# Patient Record
Sex: Female | Born: 1970 | Race: White | Hispanic: No | Marital: Married | Smoking: Former smoker
Health system: Southern US, Community
[De-identification: ages and names within clinical notes are randomized; demographics above are authoritative.]

## PROBLEM LIST (undated history)

## (undated) DIAGNOSIS — I10 Essential (primary) hypertension: Secondary | ICD-10-CM

## (undated) DIAGNOSIS — R112 Nausea with vomiting, unspecified: Secondary | ICD-10-CM

## (undated) DIAGNOSIS — Z9889 Other specified postprocedural states: Secondary | ICD-10-CM

## (undated) DIAGNOSIS — J189 Pneumonia, unspecified organism: Secondary | ICD-10-CM

## (undated) DIAGNOSIS — M1990 Unspecified osteoarthritis, unspecified site: Secondary | ICD-10-CM

## (undated) DIAGNOSIS — M797 Fibromyalgia: Secondary | ICD-10-CM

## (undated) HISTORY — PX: NASAL SINUS SURGERY: SHX719

## (undated) HISTORY — PX: WISDOM TOOTH EXTRACTION: SHX21

## (undated) HISTORY — PX: TUBAL LIGATION: SHX77

## (undated) HISTORY — PX: KNEE SURGERY: SHX244

## (undated) HISTORY — PX: TONSILLECTOMY: SUR1361

## (undated) SURGERY — Surgical Case
Anesthesia: *Unknown

---

## 1997-06-12 ENCOUNTER — Other Ambulatory Visit: Admission: RE | Admit: 1997-06-12 | Discharge: 1997-06-12 | Payer: Self-pay | Admitting: Obstetrics and Gynecology

## 1998-03-15 ENCOUNTER — Other Ambulatory Visit: Admission: RE | Admit: 1998-03-15 | Discharge: 1998-03-15 | Payer: Self-pay | Admitting: *Deleted

## 1998-09-28 ENCOUNTER — Inpatient Hospital Stay (HOSPITAL_COMMUNITY): Admission: AD | Admit: 1998-09-28 | Discharge: 1998-10-01 | Payer: Self-pay | Admitting: Obstetrics and Gynecology

## 1998-10-02 ENCOUNTER — Encounter (HOSPITAL_COMMUNITY): Admission: RE | Admit: 1998-10-02 | Discharge: 1998-12-31 | Payer: Self-pay | Admitting: Obstetrics and Gynecology

## 2000-07-07 ENCOUNTER — Encounter: Payer: Self-pay | Admitting: Family Medicine

## 2000-07-07 ENCOUNTER — Ambulatory Visit (HOSPITAL_COMMUNITY): Admission: RE | Admit: 2000-07-07 | Discharge: 2000-07-07 | Payer: Self-pay | Admitting: Family Medicine

## 2000-07-17 ENCOUNTER — Ambulatory Visit (HOSPITAL_COMMUNITY): Admission: RE | Admit: 2000-07-17 | Discharge: 2000-07-17 | Payer: Self-pay | Admitting: Family Medicine

## 2000-07-17 ENCOUNTER — Encounter: Payer: Self-pay | Admitting: Family Medicine

## 2000-09-16 ENCOUNTER — Other Ambulatory Visit: Admission: RE | Admit: 2000-09-16 | Discharge: 2000-09-16 | Payer: Self-pay | Admitting: Obstetrics and Gynecology

## 2002-02-03 ENCOUNTER — Other Ambulatory Visit: Admission: RE | Admit: 2002-02-03 | Discharge: 2002-02-03 | Payer: Self-pay | Admitting: Obstetrics and Gynecology

## 2002-03-25 ENCOUNTER — Encounter: Payer: Self-pay | Admitting: Obstetrics and Gynecology

## 2002-03-25 ENCOUNTER — Ambulatory Visit (HOSPITAL_COMMUNITY): Admission: RE | Admit: 2002-03-25 | Discharge: 2002-03-25 | Payer: Self-pay | Admitting: Obstetrics and Gynecology

## 2002-05-22 ENCOUNTER — Inpatient Hospital Stay (HOSPITAL_COMMUNITY): Admission: AD | Admit: 2002-05-22 | Discharge: 2002-05-24 | Payer: Self-pay | Admitting: Obstetrics and Gynecology

## 2002-05-23 ENCOUNTER — Encounter: Payer: Self-pay | Admitting: Obstetrics and Gynecology

## 2002-05-24 ENCOUNTER — Encounter: Payer: Self-pay | Admitting: Obstetrics and Gynecology

## 2002-06-22 ENCOUNTER — Ambulatory Visit: Admission: RE | Admit: 2002-06-22 | Discharge: 2002-06-22 | Payer: Self-pay | Admitting: Obstetrics and Gynecology

## 2002-06-22 ENCOUNTER — Encounter: Payer: Self-pay | Admitting: Cardiovascular Disease

## 2002-07-22 ENCOUNTER — Ambulatory Visit (HOSPITAL_COMMUNITY): Admission: RE | Admit: 2002-07-22 | Discharge: 2002-07-22 | Payer: Self-pay | Admitting: Obstetrics and Gynecology

## 2002-07-22 ENCOUNTER — Encounter: Payer: Self-pay | Admitting: Obstetrics and Gynecology

## 2002-07-27 ENCOUNTER — Encounter: Admission: RE | Admit: 2002-07-27 | Discharge: 2002-07-27 | Payer: Self-pay | Admitting: Obstetrics and Gynecology

## 2002-08-05 ENCOUNTER — Encounter: Admission: RE | Admit: 2002-08-05 | Discharge: 2002-08-05 | Payer: Self-pay | Admitting: Obstetrics and Gynecology

## 2002-08-11 ENCOUNTER — Encounter: Admission: RE | Admit: 2002-08-11 | Discharge: 2002-08-11 | Payer: Self-pay | Admitting: *Deleted

## 2002-08-16 ENCOUNTER — Encounter: Admission: RE | Admit: 2002-08-16 | Discharge: 2002-08-16 | Payer: Self-pay | Admitting: Obstetrics and Gynecology

## 2002-08-19 ENCOUNTER — Inpatient Hospital Stay (HOSPITAL_COMMUNITY): Admission: AD | Admit: 2002-08-19 | Discharge: 2002-08-22 | Payer: Self-pay | Admitting: Obstetrics and Gynecology

## 2002-08-19 ENCOUNTER — Encounter (INDEPENDENT_AMBULATORY_CARE_PROVIDER_SITE_OTHER): Payer: Self-pay | Admitting: Specialist

## 2003-11-30 ENCOUNTER — Ambulatory Visit (HOSPITAL_BASED_OUTPATIENT_CLINIC_OR_DEPARTMENT_OTHER): Admission: RE | Admit: 2003-11-30 | Discharge: 2003-11-30 | Payer: Self-pay | Admitting: Orthopedic Surgery

## 2004-05-23 ENCOUNTER — Ambulatory Visit (HOSPITAL_COMMUNITY): Admission: RE | Admit: 2004-05-23 | Discharge: 2004-05-23 | Payer: Self-pay | Admitting: Family Medicine

## 2005-05-02 ENCOUNTER — Encounter: Admission: RE | Admit: 2005-05-02 | Discharge: 2005-05-02 | Payer: Self-pay | Admitting: Orthopedic Surgery

## 2005-12-30 ENCOUNTER — Emergency Department (HOSPITAL_COMMUNITY): Admission: EM | Admit: 2005-12-30 | Discharge: 2005-12-31 | Payer: Self-pay | Admitting: Emergency Medicine

## 2006-02-13 ENCOUNTER — Encounter: Admission: RE | Admit: 2006-02-13 | Discharge: 2006-02-13 | Payer: Self-pay | Admitting: Obstetrics and Gynecology

## 2006-11-05 ENCOUNTER — Encounter: Admission: RE | Admit: 2006-11-05 | Discharge: 2006-11-05 | Payer: Self-pay | Admitting: Obstetrics and Gynecology

## 2008-03-10 ENCOUNTER — Ambulatory Visit: Payer: Self-pay | Admitting: Vascular Surgery

## 2008-03-10 ENCOUNTER — Encounter (INDEPENDENT_AMBULATORY_CARE_PROVIDER_SITE_OTHER): Payer: Self-pay | Admitting: Orthopedic Surgery

## 2008-03-10 ENCOUNTER — Ambulatory Visit: Admission: RE | Admit: 2008-03-10 | Discharge: 2008-03-10 | Payer: Self-pay | Admitting: Orthopedic Surgery

## 2008-07-26 ENCOUNTER — Encounter: Admission: RE | Admit: 2008-07-26 | Discharge: 2008-07-26 | Payer: Self-pay | Admitting: Family Medicine

## 2010-03-23 ENCOUNTER — Encounter: Payer: Self-pay | Admitting: Obstetrics and Gynecology

## 2010-03-24 ENCOUNTER — Encounter: Payer: Self-pay | Admitting: Obstetrics and Gynecology

## 2010-03-24 ENCOUNTER — Encounter: Payer: Self-pay | Admitting: Family Medicine

## 2010-06-19 ENCOUNTER — Other Ambulatory Visit: Payer: Self-pay | Admitting: Otolaryngology

## 2010-06-19 ENCOUNTER — Ambulatory Visit
Admission: RE | Admit: 2010-06-19 | Discharge: 2010-06-19 | Disposition: A | Discharge status: Home / Self Care | Payer: 59 | Source: Ambulatory Visit | Attending: Otolaryngology | Admitting: Otolaryngology

## 2010-06-19 DIAGNOSIS — J40 Bronchitis, not specified as acute or chronic: Secondary | ICD-10-CM

## 2010-07-19 NOTE — Op Note (Signed)
NAME:  Michele Camacho, FEW NO.:  [id number]   MEDICAL RECORD NO.:  [id number]          PATIENT TYPE:  AMB   LOCATION:  DSC                          FACILITY:  [facility]   PHYSICIAN:  Rodney A. Mortenson, M.D.DATE OF BIRTH:  08-15-1970   DATE OF PROCEDURE:  11/30/2003  DATE OF DISCHARGE:                                 OPERATIVE REPORT   PREOPERATIVE DIAGNOSIS:  Pain right knee, possible plica.   POSTOPERATIVE DIAGNOSIS:  Large plica medial compartment right knee; large  fibrous nodule intercondylar notch area adjacent to medial femoral condyle.   OPERATION:  Arthroscopy, debridement large fibrous nodule in intercondylar  notch area and excision and debridement of medial plica right knee.   SURGEON:  Lenard Galloway. Chaney Malling, M.D.   ANESTHESIA:  General anesthesia.   FINDINGS:  We arthroscoped the knee, very careful examination of the knee  was undertaken.  Patellofemoral joint was visualized first and this was  absolutely normal.  The arthroscope was then placed in the lateral  compartment.  Articular cartilage of the lateral femoral condyle, lateral  tibial plateau and __________ lateral meniscus appeared normal.  The  arthroscope was placed into the intercondylar notch area.  The ACL was seen  and visualized and palpated with probe and this was intact.  There was a  large fibrous mass adjacent to the anterior cruciate ligament and was  attached to the medial femoral condyle and anterior to the anterior  posterior cruciate ligament.  The arthroscope was then passed to the medial  compartment.  The articular cartilage showed the medial femoral condyle and  medial tibial plateau and entire circumference of the medial meniscus  normal.  There was a large medial plica present.   DESCRIPTION OF PROCEDURE:  The patient placed on the operating table in the  supine position with pneumatic tourniquet brought to the right upper thigh.  Right leg was placed in leg holder and  entire right lower extremity prepped  with DuraPrep and draped in usual manner.  Anesthesia cannula was placed in  superior medial pouch and the knee distended with saline.  Anterior median  and anterior lateral portals were made and the arthroscope was introduced.  The findings described as above.  ___________ pathology was seen in the  intercondylar notch area.  There was a large fibrous nodule anterior to the  posterior cruciate ligament and adjacent to the anterior cruciate ligament  and this was attached to the medial femoral condyle.  Intra-articular shaver  was introduced and this mass was completely debrided.  ACL and posterior  cruciate ligament were completely intact.  There was a very large medial  plica in through the medial portal.  Large medial plica was completely  debrided and debulked.  Excellent decompression was achieved.  The knee was  then filled with Marcaine.  A large bulky compressive dressing applied.  The  patient had returned to the recovery room in excellent condition.  __________ procedure extremely well.   FOLLOW-UP CARE:  1.  To my office on Wednesday.  2.  Percocet for pain.       RAM/MEDQ  D:  11/30/2003  T:  11/30/2003  Job:  [id number]

## 2010-07-19 NOTE — Discharge Summary (Signed)
   NAME:  Michele Camacho, AUGSPURGER                     ACCOUNT NO.:  [id number]   MEDICAL RECORD NO.:  [id number]                   PATIENT TYPE:  INP   LOCATION:  OPC                                  FACILITY[facility]   PHYSICIAN:  Malachi Pro. Ambrose Mantle, M.D.              DATE OF BIRTH:  Jun 29, 1970   DATE OF ADMISSION:  08/19/2002  DATE OF DISCHARGE:  08/22/2002                                 DISCHARGE SUMMARY   HISTORY OF PRESENT ILLNESS:  A 40 year old white female para 3, 0/0/3,  gravida 4 with [facility] August 24, 2002. Admitted for repeat C-section and tubal  ligation. Blood group and type A+ with a negative antibody, nonreactive  serology, Rubella immune, Hepatitis B surface antigen negative, HIV  negative, GC and Chlamydia negative, one hour Glucola 101, group B strep  positive. The patient underwent a low transverse cervical C-section and  tubal ligation by Dr. Sherilyn Cooter with Dr. Senaida Ores assisting with the spinal  anesthesia with delivery of an 8 pound 0 ounce female infant. Apgar's of 9  at one and 9 at five minutes. Postpartum, the patient did quite well. Her  course was completely uncomplicated and she was discharged on the third  postop day. She was voiding well. Ambulating well without difficulty. Her  abdomen was soft and nontender. Staples removed and strips were applied.  Incision was healing well. Initial hemoglobin 12.3, hematocrit 36.2, white  count 10,200. Followup hemoglobin 10.4 and hematocrit 30.4. White count  12,700. Comprehensive metabolic profile was normal.. Urinalysis was  negative. She was A+ with a negative antibody. RPR is nonreactive.   FINAL DIAGNOSES:  Intrauterine pregnancy 39+ weeks. Delivered vertex by C-  section. Voluntary sterilization.   OPERATION:  Low transverse surgical Cesarean section and bilateral tubal  ligation.   FINAL CONDITION ON DISCHARGE:  Improved.   INSTRUCTIONS:  Include regular discharge instruction booklet. Call with any  unusual problems  voiding, temperature greater than 100.4 degrees. Report any  heavy vaginal bleeding.   FOLLOW UP:  Return to the office in 10-14 days for followup examination.                                               Malachi Pro. Ambrose Mantle, M.D.    TFH/MEDQ  D:  08/22/2002  T:  08/22/2002  Job:  [id number]

## 2010-07-19 NOTE — Discharge Summary (Signed)
NAME:  Michele Camacho, Michele Camacho                     ACCOUNT NO.:  [id number]   MEDICAL RECORD NO.:  [id number]                   PATIENT TYPE:  INP   LOCATION:  9153                                 FACILITY:  [facility]   PHYSICIAN:  Huel Cote, M.D.              DATE OF BIRTH:  [birth year]   DATE OF ADMISSION:  05/22/2002  DATE OF DISCHARGE:  05/24/2002                                 DISCHARGE SUMMARY   DISCHARGE DIAGNOSES:  1. Pregnancy at 26 weeks with questionable leakage of fluid.  2. Preterm labor.  3. Status post magnesium tocolysis and betamethasone.   DISCHARGE FOLLOW-UP:  The patient is to follow up in the office in  approximately a week-and-a-half and continue modified pelvic rest at home.   DISCHARGE MEDICATIONS:  Terbutaline 5 mg p.o. q.4-6h. p.r.n.   HISTORY OF PRESENT ILLNESS:  The patient is a 40 year old G4 P3-0-0-3 who  was admitted at 32 and six-sevenths weeks after she presented to triage with  a complaint of possible leakage fluid.  Despite an exam by both the nurse  practitioner and the physician, there was no documented rupture of membranes  and no fluid was identified despite the patient's convincing story.  This  pregnancy thus far had been complicated by first trimester bleeding;  otherwise, was unremarkable.   PAST OBSTETRICAL HISTORY:  The patient had preterm labor in her first two  pregnancies, all resulting in term cesarean sections.   PAST GYNECOLOGICAL HISTORY:  None.   PAST MEDICAL HISTORY:  None.   PAST SURGICAL HISTORY:  Three cesarean section and arthroscopy of her left  knee.   HOSPITAL COURSE:  On admission she was afebrile, vital signs were stable.  Cervix was long and closed.  Fetal heart rate was reassuring.  Fetal  fibronectin was performed and the patient was given terbutaline initially to  treat the contractions which were uncomfortable to the patient.  The patient  failed to respond to the terbutaline and because of her regular  contractions  the decision was made to proceed with admission and magnesium tocolysis.  Her fetal fibronectin eventually returned negative and even with the  magnesium the patient did not initially stop contracting.  Cervix remained  long and closed and the magnesium on hospital day #3 was discontinued  secondary to a feeling of subjective difficulty breathing and chest pain by  the patient.  Pulse oximeter was completely normal and chest x-ray was  normal.  However, given her symptoms the patient was given Indocin and  Procardia and the magnesium left discontinued.  By the end of hospital day  #3 she was doing well.  She had no significant contractions - only one or  two an hour - and her chest pain and shortness of breath had essentially  resolved except for some mild symptoms  which persisted with lying flat.  The cervix, again, was long and closed.  Therefore, the decision was made to discharge  the patient home on her  Procardia for control on contractions, and modified bedrest.  She will  follow up in my office in approximately one week.                                               Huel Cote, M.D.    KR/MEDQ  D:  06/09/2002  T:  06/10/2002  Job:  [id number]

## 2010-07-19 NOTE — Op Note (Signed)
NAME:  Michele Camacho, Michele Camacho                     ACCOUNT NO.:  [id number]   MEDICAL RECORD NO.:  [id number]                   PATIENT TYPE:  INP   LOCATION:  9116                                 [facility]ACILITY:  [facility]   PHYSICIAN:  Malachi Pro. Ambrose Mantle, M.D.              DATE OF BIRTH:  [birth year]   DATE OF PROCEDURE:  08/19/2002  DATE OF DISCHARGE:                                 OPERATIVE REPORT   PREOPERATIVE DIAGNOSES:  1. Intrauterine pregnancy at 39 plus weeks.  2. Three previous cesarean sections.  3. Voluntary sterilization.   POSTOPERATIVE DIAGNOSES:  1. Intrauterine pregnancy at 39 plus weeks.  2. Three previous cesarean sections.  3. Voluntary sterilization.   OPERATION:  1. Low transverse cervical cesarean section.  2. Tubal ligation.   OPERATOR:  Malachi Pro. Ambrose Mantle, M.D.   ASSISTANT:  Huel Cote, M.D.   Spinal anesthesia.   The patient is brought to the operating room.  Fetal heart tones were  established to be normal prior to surgery and after the spinal.  The patient  was placed in the left lateral tilt position.  The abdomen was prepped with  Betadine solution, the urethra was prepped, and a Foley catheter was  inserted to straight drain.  The abdomen was draped as a sterile field.  The  patient had three previous incision sites.  We chose the most superior and  made an incision through that incision through the skin, subcutaneous  tissue, and fascia, separated the fascia transversely, dissected it free  from the rectus muscle superiorly and inferiorly.  The peritoneum was opened  vertically.  There was a minimal amount of scar tissue, mainly in the  abdominal wall on the left.  There was no intraperitoneal scar tissue that I  could see.  On inspecting the lower uterine segment we could see the baby's  hair and fluid through the lower uterine segment, although there was no true  rupture.  I made an incision into the lower uterine segment down to the  amniotic sac  and then basically almost without cutting, the incision  separated transversely.  There was no separate incision for the peritoneum,  it was almost just one layer.  The baby's head sort of pushed through the  incision.  With a little fundal pressure we were able to push the entire  head out, suction the nose and mouth with the bulb, delivered the remainder  of the body, clamped the cord, gave the infant to Dr. Eric Form, who was in  attendance, preserved a segment of cord blood in case a pH was necessary.  The infant was a female, 8 pounds 0 ounce, with Apgars of 9 at one and 9 at  five minutes.  The placenta was removed intact.  The inside of the uterus  was inspected and found to be free of any placental fragments.  The cervix  was dilated with a ring forceps and the very  thin lower uterine segment was  reapproximated with a running locked suture of 0 Vicryl coming from the left  corner to the midline and then from the right corner to the midline.  There  were a couple of additional sutures required to make the tissue intact.  There was no significant bleeding.  Both tubes and ovaries appeared normal.  The midportion of each tube was drawn up.  A window was made in the  mesosalpinx.  Two ties of 0 plain catgut were placed proximally and distally  on each tube.  This intervening portion of tube was excised and saved for  pathology.  There was no bleeding.  All sutures were cut.  Reinspection of  the uterine incision revealed good hemostasis.  Liberal irrigation was done  to ensure complete hemostasis, and then the abdominal wall was closed in  layers using 0 Vicryl on the rectus muscle interrupted sutures, two running  sutures of 0 Vicryl on the fascia, a running 3-0 Vicryl on the subcu tissue,  and staples on the skin.  The patient seemed to tolerate the procedure well.  Blood loss was about 1000 mL.  Sponge and needle count were correct, and she  was returned to recovery in satisfactory  condition.                                               Malachi Pro. Ambrose Mantle, M.D.    TFH/MEDQ  D:  08/19/2002  T:  08/19/2002  Job:  [id number]

## 2010-07-19 NOTE — H&P (Signed)
NAME:  Michele Camacho, Michele Camacho                     ACCOUNT NO.:  [id number]   MEDICAL RECORD NO.:  [id number]                   PATIENT TYPE:  OUT   LOCATION:  OPC                                  FACILITY:  [facility]   PHYSICIAN:  Malachi Pro. Ambrose Mantle, M.D.              DATE OF BIRTH:  08-27-1970   DATE OF ADMISSION:  08/19/2002  DATE OF DISCHARGE:                                HISTORY & PHYSICAL   HISTORY OF PRESENT ILLNESS:  This is a 40 year old white married female,  para 3-0-0-3, gravida 4, admitted for repeat cesarean section.  The patient  has had three previous cesarean sections.  Blood group and type A positive.  Negative antibody.  Nonreactive serology.  Rubella immune.  Hepatitis B  surface antigen negative.  HIV negative.  GC and Chlamydia negative.  She  declined triple screen.  Group B strep positive.  One hour Glucola 101.  This patient had a vaginal ultrasound on January 20, 2002, crown rump  length 2.35 cm, 9 weeks 1 day, [facility] August 24, 2002.  The patient's perinatal  course was complicated by possible preterm labor and premature rupture of  the membranes at 26 weeks and 6 days gestation.  After observation, it was  decided that she had neither ruptured membranes nor premature labor.  Ultrasound done at that time showed a possible short femur.  The patient is  scheduled for a repeat C-section and tubal ligation.  She has been counseled  about the tubal but insists that she wants to go ahead.  The patient was  seen in consultation by Dr. Tenny Craw during the pregnancy because of  palpitations and chest pressure.  Dr. Tenny Craw did not find a major cardiac  abnormality.  On Jul 22, 2002, the patient's size was greater than dates.  She underwent an ultrasound that showed polyhydramnios.  She has been  followed by nonstress tests since that time.  They have been reactive.  She  is admitted now for the repeat C-section.   ALLERGIES:  No known allergies.   PAST SURGICAL HISTORY:  She has  had three C-sections, in 1992, 1998 and  2000.  Arthroscopy of her left knee   ILLNESSES:  No significant adult illnesses.  There is a questionable history  of herpes simplex virus.   FAMILY HISTORY:  Maternal grandfather and paternal grandfather have high  blood pressure.  Paternal grandmother breast cancer.  Her father has  diabetes, and her paternal grandfather has diabetes.   REVIEW OF SYSTEMS:  Noncontributory.   PHYSICAL EXAMINATION:  GENERAL:  Well-developed, well-nourished female in no  acute distress.  VITAL SIGNS:  Blood pressure 120/72, weight 168-1/2 pounds, pulse is 80.  HEENT:  No cranial abnormalities.  Extraocular movements are intact.  Nose  and pharynx are clear.  There is an upper dental plate.  NECK:  The neck is supple without thyromegaly.  HEART:  Normal size and sounds.  No murmurs.  LUNGS:  Clear to auscultation and percussion.  ABDOMEN:  Soft, fundal height 41 cm.  Fetal heart tones normal.  PELVIC:  Cervix is long and closed.  Presenting part is high.   ADMITTING IMPRESSION:  1. Intrauterine pregnancy at 39+ weeks.  2. Three previous cesarean sections.  3. Desires sterilization.   PLAN:  The patient is admitted for repeat C-section and tubal ligation.  She  understands the risks of surgery and is ready to proceed.                                               Malachi Pro. Ambrose Mantle, M.D.    TFH/MEDQ  D:  08/18/2002  T:  08/19/2002  Job:  [id number]

## 2011-04-18 ENCOUNTER — Encounter (HOSPITAL_COMMUNITY): Payer: Self-pay | Admitting: *Deleted

## 2011-04-18 ENCOUNTER — Observation Stay (HOSPITAL_COMMUNITY)
Admission: EM | Admit: 2011-04-18 | Discharge: 2011-04-19 | Disposition: A | Discharge status: Home / Self Care | Payer: 59 | Attending: Internal Medicine | Admitting: Internal Medicine

## 2011-04-18 ENCOUNTER — Emergency Department (HOSPITAL_COMMUNITY): Payer: 59

## 2011-04-18 ENCOUNTER — Other Ambulatory Visit: Payer: Self-pay

## 2011-04-18 DIAGNOSIS — R232 Flushing: Secondary | ICD-10-CM | POA: Insufficient documentation

## 2011-04-18 DIAGNOSIS — R29898 Other symptoms and signs involving the musculoskeletal system: Secondary | ICD-10-CM | POA: Insufficient documentation

## 2011-04-18 DIAGNOSIS — Z79899 Other long term (current) drug therapy: Secondary | ICD-10-CM | POA: Insufficient documentation

## 2011-04-18 DIAGNOSIS — R002 Palpitations: Secondary | ICD-10-CM | POA: Insufficient documentation

## 2011-04-18 DIAGNOSIS — I10 Essential (primary) hypertension: Secondary | ICD-10-CM | POA: Insufficient documentation

## 2011-04-18 DIAGNOSIS — R110 Nausea: Secondary | ICD-10-CM | POA: Insufficient documentation

## 2011-04-18 DIAGNOSIS — R55 Syncope and collapse: Principal | ICD-10-CM | POA: Insufficient documentation

## 2011-04-18 HISTORY — DX: Other specified postprocedural states: Z98.890

## 2011-04-18 HISTORY — DX: Other specified postprocedural states: R11.2

## 2011-04-18 HISTORY — DX: Unspecified osteoarthritis, unspecified site: M19.90

## 2011-04-18 HISTORY — DX: Essential (primary) hypertension: I10

## 2011-04-18 LAB — COMPREHENSIVE METABOLIC PANEL
AST: 17 U/L (ref 0–37)
Alkaline Phosphatase: 64 U/L (ref 39–117)
BUN: 12 mg/dL (ref 6–23)
GFR calc Af Amer: >90 mL/min (ref >90)
GFR calc non Af Amer: 78 mL/min — ABNORMAL LOW (ref >90)
Potassium: 3.8 mEq/L (ref 3.5–5.1)
Total Bilirubin: 0.4 mg/dL (ref 0.3–1.2)

## 2011-04-18 LAB — URINALYSIS, ROUTINE W REFLEX MICROSCOPIC
Glucose, UA: NEGATIVE mg/dL
Hgb urine dipstick: NEGATIVE
Specific Gravity, Urine: 1.02 (ref 1.005–1.030)
Urobilinogen, UA: 1 mg/dL (ref 0.0–1.0)

## 2011-04-18 LAB — DIFFERENTIAL
Basophils Absolute: 0.1 10*3/uL (ref 0.0–0.1)
Basophils Relative: 1 % (ref 0–1)
Eosinophils Relative: 1 % (ref 0–5)
Lymphocytes Relative: 19 % (ref 12–46)
Monocytes Absolute: 0.9 10*3/uL (ref 0.1–1.0)

## 2011-04-18 LAB — CBC
RBC: 4.79 MIL/uL (ref 3.87–5.11)
WBC: 10.3 10*3/uL (ref 4.0–10.5)

## 2011-04-18 LAB — CARDIAC PANEL(CRET KIN+CKTOT+MB+TROPI)
Total CK: 62 U/L (ref 7–177)
Troponin I: <0.3 ng/mL (ref <0.30)

## 2011-04-18 LAB — PREGNANCY, URINE: Preg Test, Ur: NEGATIVE

## 2011-04-18 MED ORDER — SODIUM CHLORIDE 0.9 % IV BOLUS (SEPSIS)
1000.0000 mL | Freq: Once | INTRAVENOUS | Status: AC
  Administered 2011-04-18: 1000 mL via INTRAVENOUS

## 2011-04-18 MED ORDER — POLYETHYLENE GLYCOL 3350 17 G PO PACK
17.0000 g | PACK | Freq: Every day | ORAL | Status: DC | PRN
  Filled 2011-04-18: qty 1

## 2011-04-18 MED ORDER — HEPARIN SODIUM (PORCINE) 5000 UNIT/ML IJ SOLN
5000.0000 [IU] | Freq: Three times a day (TID) | INTRAMUSCULAR | Status: DC
  Administered 2011-04-18 – 2011-04-19 (×2): 5000 [IU] via SUBCUTANEOUS
  Filled 2011-04-18 (×5): qty 1

## 2011-04-18 MED ORDER — ACETAMINOPHEN 325 MG PO TABS: 650.0000 mg | ORAL_TABLET | Freq: Four times a day (QID) | ORAL | Status: DC | PRN

## 2011-04-18 MED ORDER — ONDANSETRON HCL 4 MG PO TABS: 4.0000 mg | ORAL_TABLET | Freq: Four times a day (QID) | ORAL | Status: DC | PRN

## 2011-04-18 MED ORDER — SODIUM CHLORIDE 0.9 % IV SOLN
INTRAVENOUS | Status: DC
  Administered 2011-04-18: 21:00:00 via INTRAVENOUS

## 2011-04-18 MED ORDER — SODIUM CHLORIDE 0.9 % IJ SOLN
3.0000 mL | Freq: Two times a day (BID) | INTRAMUSCULAR | Status: DC
  Administered 2011-04-18: 3 mL via INTRAVENOUS

## 2011-04-18 MED ORDER — ACETAMINOPHEN 650 MG RE SUPP: 650.0000 mg | Freq: Four times a day (QID) | RECTAL | Status: DC | PRN

## 2011-04-18 MED ORDER — ONDANSETRON HCL 4 MG/2ML IJ SOLN: 4.0000 mg | Freq: Four times a day (QID) | INTRAMUSCULAR | Status: DC | PRN

## 2011-04-18 NOTE — ED Notes (Signed)
BJY:[id number]<FA> Expected date:04/18/11<BR> Expected time:12:54 PM<BR> Means of arrival:Ambulance<BR> Comments:<BR> EMS 41 GC - syncope

## 2011-04-18 NOTE — ED Notes (Signed)
Pt was a work, standing without physical exertion, became lightheaded and passed out. Neuro intact.  Pt reports headache at this time. No vomiting, nausea

## 2011-04-18 NOTE — ED Notes (Signed)
Pt fell forward when she fell. Pain in left knee and frontal area of head

## 2011-04-18 NOTE — H&P (Signed)
Hospital Admission Note Date: 04/18/2011  PCP: Johny Blamer, MD, MD  Chief Complaint: Syncope  History of Present Illness: This is a 41 year old female with past medical history of hypertension that comes in for syncope. The patient relates that she saw her doctor 1 day prior to admission. Her primary care Dr. will change her blood pressure medication. She relates she woke up the morning of admission. Which is a little bit woozy. She went to work when she went to work she was feeling hot, with palpitations she got nauseated. And the next thing she remembers her coworkers were beside her. They said that she had. She did not lose control of her urinary bladder or bowels. She did her tongue. She relates she was out for about 30 seconds.  She relates no fever chills burning when she urinates cough or diarrhea. No sick contacts.   Allergies: Review of patient's allergies indicates no known allergies. Past Medical History  Diagnosis Date  . Hypertension   . Seizures 04/18/2011    new onset today  . Arthritis     bilateral knees  . PONV (postoperative nausea and vomiting)    Prior to Admission medications   Medication Sig Start Date End Date Taking? Authorizing Provider  lisinopril-hydrochlorothiazide (PRINZIDE,ZESTORETIC) 10-12.5 MG per tablet Take 1 tablet by mouth daily.   Yes Historical Provider, MD   Past Surgical History  Procedure Date  . Knee surgery     1 on left, 2 on the right knee  . Cesarean section     x 4  . Tubal ligation    History reviewed. No pertinent family history. History   Social History  . Marital Status: Married    Spouse Name: N/A    Number of Children: N/A  . Years of Education: N/A   Occupational History  . Not on file.   Social History Main Topics  . Smoking status: Former Smoker    Quit date: 04/18/1991  . Smokeless tobacco: Never Used  . Alcohol Use: No  . Drug Use: No  . Sexually Active: Yes    Birth Control/ Protection: Other-see  comments     tubal ligation   Other Topics Concern  . Not on file   Social History Narrative  . No narrative on file    REVIEW OF SYSTEMS:  Constitutional:  No weight loss, night sweats, Fevers, chills, fatigue.  HEENT:  No headaches, Difficulty swallowing,Tooth/dental problems,Sore throat,  No sneezing, itching, ear ache, nasal congestion, post nasal drip,  Cardio-vascular:  No chest pain, Orthopnea, PND, swelling in lower extremities, anasarca, dizziness, palpitations  GI:  No heartburn, indigestion, abdominal pain, nausea, vomiting, diarrhea, change in bowel habits, loss of appetite  Resp:  No shortness of breath with exertion or at rest. No excess mucus, no productive cough, No non-productive cough, No coughing up of blood.No change in color of mucus.No wheezing.No chest wall deformity  Skin:  no rash or lesions.  GU:  no dysuria, change in color of urine, no urgency or frequency. No flank pain.  Musculoskeletal:  No joint pain or swelling. No decreased range of motion. No back pain.  Psych:  No change in mood or affect. No depression or anxiety. No memory loss.   Physical Exam: Filed Vitals:   04/18/11 1521 04/18/11 1522 04/18/11 1525 04/18/11 1644  BP: 107/59 118/59 117/80 110/53  Pulse: 82 86 100 87  Temp:    98.5 F (36.9 C)  TempSrc:    Oral  Resp:  18  SpO2: 100%   100%   No intake or output data in the 24 hours ending 04/18/11 1739 BP 110/53  Pulse 87  Temp(Src) 98.5 F (36.9 C) (Oral)  Resp 18  SpO2 100%  LMP 03/13/2011  General Appearance:    Alert, cooperative, no distress, appears stated age  Head:    Normocephalic, without obvious abnormality, atraumatic  Eyes:    PERRL, conjunctiva/corneas clear, EOM's intact, fundi    benign, both eyes  Ears:    Normal TM's and external ear canals, both ears  Nose:   Nares normal, septum midline, mucosa normal, no drainage    or sinus tenderness  Throat:   Lips, mucosa, and tongue normal; teeth and gums  normal  Neck:   Supple, symmetrical, trachea midline, no adenopathy;    thyroid:  no enlargement/tenderness/nodules; no carotid   bruit or JVD  Back:     Symmetric, no curvature, ROM normal, no CVA tenderness  Lungs:     Clear to auscultation bilaterally, respirations unlabored  Chest Wall:    No tenderness or deformity   Heart:    Regular rate and rhythm, S1 and S2 normal, no murmur, rub   or gallop     Abdomen:     Soft, non-tender, bowel sounds active all four quadrants,    no masses, no organomegaly        Extremities:   Extremities normal, atraumatic, no cyanosis or edema  Pulses:   2+ and symmetric all extremities  Skin:   Skin color, texture, turgor normal, no rashes or lesions  Lymph nodes:   Cervical, supraclavicular, and axillary nodes normal  Neurologic:   CNII-XII intact, normal strength, sensation and reflexes    throughout   Lab results:  Basename 04/18/11 1346  NA 135  K 3.8  CL 97  CO2 28  GLUCOSE 148*  BUN 12  CREATININE 0.91  CALCIUM 10.0  MG --  PHOS --    Basename 04/18/11 1346  AST 17  ALT 12  ALKPHOS 64  BILITOT 0.4  PROT 7.7  ALBUMIN 4.7   No results found for this basename: LIPASE:2,AMYLASE:2 in the last 72 hours  Basename 04/18/11 1346  WBC 10.3  NEUTROABS 7.3  HGB 14.6  HCT 41.8  MCV 87.3  PLT 246    Basename 04/18/11 1346  CKTOTAL 56  CKMB 1.7  CKMBINDEX --  TROPONINI <0.30   No components found with this basename: POCBNP:3 No results found for this basename: DDIMER:2 in the last 72 hours No results found for this basename: HGBA1C:2 in the last 72 hours No results found for this basename: CHOL:2,HDL:2,LDLCALC:2,TRIG:2,CHOLHDL:2,LDLDIRECT:2 in the last 72 hours No results found for this basename: TSH,T4TOTAL,FREET3,T3FREE,THYROIDAB in the last 72 hours No results found for this basename: VITAMINB12:2,FOLATE:2,FERRITIN:2,TIBC:2,IRON:2,RETICCTPCT:2 in the last 72 hours Imaging results:  Ct Head Wo Contrast  04/18/2011   *RADIOLOGY REPORT*  Clinical Data: Syncope.  Left-sided weakness.  CT HEAD WITHOUT CONTRAST  Technique:  Contiguous axial images were obtained from the base of the skull through the vertex without contrast.  Comparison: 12/30/2005  Findings: No acute intracranial abnormality.  Specifically, no hemorrhage, hydrocephalus, mass lesion, acute infarction, or significant intracranial injury.  No acute calvarial abnormality. Mastoids are clear.  Orbital soft tissues unremarkable.  IMPRESSION: No acute intracranial abnormality.  Original Report Authenticated By: Cyndie Chime, M.D.   Other results: EKG: normal EKG, normal sinus rhythm.   Patient Active Hospital Problem List: Syncope and collapse (04/18/2011)  I will  admit for 23 hour observations, on telemetry, will check a TSH, 2-D echo cycle her cardiac enzymes. We'll also check orthostatics. The most likely cause for her syncope is probably orthostatic hypotension. Pointing to dehydration is her chloride that it is less than 100, also her prodromal symptoms.  We'll monitor for any kind of arrhythmias. Check a TSH and  B12. I will start her on IV fluids. And check a basic metabolic panel in the morning.  HTN (hypertension) (04/18/2011) Her blood pressure medication and if she is to be sent out: 1 when she is discharged because her blood pressure is greater than 150/90. With only chews 1 blood pressure medication. She is currently on a combination pilllosartan plus hydrochlorothiazide   Code Status: Full code Family Communication: [phone]   Marinda Elk M.D. [facility] [phone] 04/18/2011, 5:39 PM

## 2011-04-18 NOTE — ED Provider Notes (Addendum)
History     CSN: [id number]  Arrival date & time 04/18/11  1300   First MD Initiated Contact with Patient 04/18/11 1318      Chief Complaint  Patient presents with  . Loss of Consciousness    had bp meds changed yesterday    (Consider location/radiation/quality/duration/timing/severity/associated sxs/prior treatment) Patient is a 41 y.o. female presenting with syncope. The history is provided by the patient.  Loss of Consciousness This is a new problem. The problem has been resolved. Associated symptoms include headaches. Pertinent negatives include no chest pain, no abdominal pain and no shortness of breath. The symptoms are aggravated by nothing.   Pt started new blood pressure starting this AM. At work she became lightheaded and sat down. At that point she lost consciousness and had what her manager called a "mild seizure". No incontinence. +tongue biting. No prev LOC or seizure events. Now c/o mild HA and generalized weakness.   No past medical history on file.  No past surgical history on file.  No family history on file.  History  Substance Use Topics  . Smoking status: Not on file  . Smokeless tobacco: Not on file  . Alcohol Use: Not on file    OB History    No data available      Review of Systems  Constitutional: Negative for fever and chills.  HENT: Negative for facial swelling, neck pain and neck stiffness.   Respiratory: Negative for chest tightness and shortness of breath.   Cardiovascular: Positive for syncope. Negative for chest pain and palpitations.  Gastrointestinal: Negative for nausea, vomiting and abdominal pain.  Genitourinary: Negative for dysuria, hematuria, flank pain, vaginal bleeding and pelvic pain.  Musculoskeletal: Negative for myalgias, back pain, joint swelling and arthralgias.  Neurological: Positive for seizures, syncope, light-headedness and headaches. Negative for dizziness, weakness and numbness.    Allergies  Review of  patient's allergies indicates no known allergies.  Home Medications   Current Outpatient Rx  Name Route Sig Dispense Refill  . LISINOPRIL-HYDROCHLOROTHIAZIDE 10-12.5 MG PO TABS Oral Take 1 tablet by mouth daily.      BP 110/53  Pulse 87  Temp(Src) 98.5 F (36.9 C) (Oral)  Resp 18  SpO2 100%  LMP 03/13/2011  Physical Exam  Nursing note and vitals reviewed. Constitutional: She is oriented to person, place, and time. She appears well-developed and well-nourished. No distress.  HENT:  Head: Normocephalic.  Mouth/Throat: Oropharynx is clear and moist.       Mild trauma to tip of tongue, scalp contusion present R parietal region  Eyes: EOM are normal. Pupils are equal, round, and reactive to light.  Neck: Normal range of motion. Neck supple.       No cervical midline ttp or meningismus   Cardiovascular: Normal rate and regular rhythm.   Pulmonary/Chest: Effort normal and breath sounds normal. No respiratory distress. She has no wheezes. She has no rales.  Abdominal: Soft. Bowel sounds are normal. There is no tenderness. There is no rebound and no guarding.  Musculoskeletal: Normal range of motion. She exhibits no edema and no tenderness.  Neurological: She is alert and oriented to person, place, and time.       Finger to nose intact, 5/5 motor, sensation normal   Skin: Skin is warm and dry. No rash noted. No erythema.  Psychiatric: She has a normal mood and affect. Her behavior is normal.    ED Course  Procedures (including critical care time)  Labs Reviewed  COMPREHENSIVE METABOLIC  PANEL - Abnormal; Notable for the following:    Glucose, Bld 148 (*)    GFR calc non Af Amer 78 (*)    All other components within normal limits  CBC  DIFFERENTIAL  URINALYSIS, ROUTINE W REFLEX MICROSCOPIC  PREGNANCY, URINE  CARDIAC PANEL(CRET KIN+CKTOT+MB+TROPI)   Ct Head Wo Contrast  04/18/2011  *RADIOLOGY REPORT*  Clinical Data: Syncope.  Left-sided weakness.  CT HEAD WITHOUT CONTRAST   Technique:  Contiguous axial images were obtained from the base of the skull through the vertex without contrast.  Comparison: 12/30/2005  Findings: No acute intracranial abnormality.  Specifically, no hemorrhage, hydrocephalus, mass lesion, acute infarction, or significant intracranial injury.  No acute calvarial abnormality. Mastoids are clear.  Orbital soft tissues unremarkable.  IMPRESSION: No acute intracranial abnormality.  Original Report Authenticated By: Cyndie Chime, M.D.     1. Syncope      Date: 04/18/2011  Rate: 70  Rhythm: normal sinus rhythm  QRS Axis: normal  Intervals: normal  ST/T Wave abnormalities: normal  Conduction Disutrbances:none  Narrative Interpretation:   Old EKG Reviewed: unchanged    MDM      Spoke with Dr Lyman Speller. If hospitalist team would like consult can re-page. No immediate treatment  Loren Racer, MD 04/18/11 1648  Loren Racer, MD 04/18/11 [phone]

## 2011-04-19 DIAGNOSIS — R55 Syncope and collapse: Secondary | ICD-10-CM

## 2011-04-19 LAB — COMPREHENSIVE METABOLIC PANEL
BUN: 9 mg/dL (ref 6–23)
CO2: 24 mEq/L (ref 19–32)
Calcium: 8.9 mg/dL (ref 8.4–10.5)
Chloride: 105 mEq/L (ref 96–112)
Creatinine, Ser: 0.65 mg/dL (ref 0.50–1.10)
GFR calc Af Amer: >90 mL/min (ref >90)
GFR calc non Af Amer: >90 mL/min (ref >90)
Total Bilirubin: 0.4 mg/dL (ref 0.3–1.2)

## 2011-04-19 LAB — CBC
Platelets: 239 10*3/uL (ref 150–400)
RBC: 4.21 MIL/uL (ref 3.87–5.11)
WBC: 7.4 10*3/uL (ref 4.0–10.5)

## 2011-04-19 LAB — TSH: TSH: 1.613 u[IU]/mL (ref 0.350–4.500)

## 2011-04-19 LAB — VITAMIN B12: Vitamin B-12: 356 pg/mL (ref 211–911)

## 2011-04-19 MED ORDER — LORAZEPAM 1 MG PO TABS: 1.0000 mg | ORAL_TABLET | Freq: Every day | ORAL | Status: DC | PRN

## 2011-04-19 NOTE — Discharge Summary (Signed)
Patient ID: Michele Camacho MRN: [id number] DOB/AGE: 1970/11/05 41 y.o.  Admit date: 04/18/2011 Discharge date: 04/19/2011  Primary Care Physician:  Johny Blamer, MD, MD  Discharge Diagnoses:    Present on Admission:  .Syncope and collapse .HTN (hypertension)  Active Problems:  Syncope and collapse  HTN (hypertension)   Medication List  As of 04/19/2011  9:45 AM   TAKE these medications         lisinopril-hydrochlorothiazide 10-12.5 MG per tablet   Commonly known as: PRINZIDE,ZESTORETIC   Take 1 tablet by mouth daily.      LORazepam 1 MG tablet   Commonly known as: ATIVAN   Take 1 tablet (1 mg total) by mouth daily as needed for anxiety.            Disposition and Follow-up: with PCP in 2 weeks  Consults: none  Significant Diagnostic Studies:  No results found.  Brief H and P: This is a 41 year old female with past medical history of hypertension that comes in for syncope. The patient relates that she saw her doctor 1 day prior to admission. Her primary care Dr. Elayne Snare her blood pressure medication. She relates she woke up the morning of admission. Which is a little bit woozy. She went to work and was feeling hot, with palpitations she got nauseated. And the next thing she remembers her coworkers were beside her. They said that she had fell and passed out. She did not lose control of her urinary bladder or bowels. She did not bite her tongue. She relates she was out for about 30 seconds.  She relates no fevers, chills, burning when she urinates, cough, or diarrhea. No sick contacts.   Physical Exam on Discharge:  Filed Vitals:   04/18/11 2118 04/18/11 2120 04/18/11 2122 04/19/11 0516  BP: 115/74 114/74 121/86 103/66  Pulse: 73 85 89 79  Temp:    98 F (36.7 C)  TempSrc:    Oral  Resp:    20  Height:      Weight:      SpO2:    96%     Intake/Output Summary (Last 24 hours) at 04/19/11 0945 Last data filed at 04/19/11 0600  Gross per 24 hour    Intake 1697.5 ml  Output   1500 ml  Net  197.5 ml    General: Alert, awake, oriented x3, in no acute distress. HEENT: No bruits, no goiter. Heart: Regular rate and rhythm, without murmurs, rubs, gallops. Lungs: Clear to auscultation bilaterally. Abdomen: Soft, nontender, nondistended, positive bowel sounds. Extremities: No clubbing cyanosis or edema with positive pedal pulses. Neuro: Grossly intact, nonfocal.  CBC:    Component Value Date/Time   WBC 7.4 04/19/2011 0459   HGB 12.7 04/19/2011 0459   HCT 37.4 04/19/2011 0459   PLT 239 04/19/2011 0459   MCV 88.8 04/19/2011 0459   NEUTROABS 7.3 04/18/2011 1346   LYMPHSABS 1.9 04/18/2011 1346   MONOABS 0.9 04/18/2011 1346   EOSABS 0.1 04/18/2011 1346   BASOSABS 0.1 04/18/2011 1346    Basic Metabolic Panel:    Component Value Date/Time   NA 138 04/19/2011 0459   K 3.9 04/19/2011 0459   CL 105 04/19/2011 0459   CO2 24 04/19/2011 0459   BUN 9 04/19/2011 0459   CREATININE 0.65 04/19/2011 0459   GLUCOSE 97 04/19/2011 0459   CALCIUM 8.9 04/19/2011 0459    Hospital Course:  Active Problems:  Syncope and collapse - perhaps multifactorial, medication effect, neurocardiogenic syncope, dehydration - now  resolved - I have provided education about syncopal event over 30 minutes - TSH, CA's, and electrolyte panel, along with CBC is within normal limits   HTN (hypertension) - will defer to PCP if pt needs to continue medication in an outpatient setting  Time spent on Discharge: Over 30 minutes  Signed: Debbora Presto 04/19/2011, 9:45 AM  [phone]

## 2011-04-19 NOTE — Discharge Instructions (Signed)
Near-Syncope Near-syncope is sudden weakness, dizziness, or feeling like you might pass out (faint). This may occur when getting up after sitting or while standing for a long period of time. Near-syncope can be caused by a drop in blood pressure. This is a common reaction, but it may occur to a greater degree in people taking medicines to control their blood pressure. Fainting often occurs when the blood pressure or pulse is too low to provide enough blood flow to the brain to keep you conscious. Fainting and near-syncope are not usually due to serious medical problems. However, certain people should be more cautious in the event of near-syncope, including elderly patients, patients with diabetes, and patients with a history of heart conditions (especially irregular rhythms).  CAUSES   Drop in blood pressure.   Physical pain.   Dehydration.   Heat exhaustion.   Emotional distress.   Low blood sugar.   Internal bleeding.   Heart and circulatory problems.   Infections.  SYMPTOMS   Dizziness.   Feeling sick to your stomach (nauseous).   Nearly fainting.   Body numbness.   Turning pale.   Tunnel vision.   Weakness.  HOME CARE INSTRUCTIONS   Lie down right away if you start feeling like you might faint. Breathe deeply and steadily. Wait until all the symptoms have passed. Most of these episodes last only a few minutes. You may feel tired for several hours.   Drink enough fluids to keep your urine clear or pale yellow.   If you are taking blood pressure or heart medicine, get up slowly, taking several minutes to sit and then stand. This can reduce dizziness that is caused by a drop in blood pressure.  SEEK IMMEDIATE MEDICAL CARE IF:   You have a severe headache.   Unusual pain develops in the chest, abdomen, or back.   There is bleeding from the mouth or rectum, or you have black or tarry stool.   An irregular heartbeat or a very rapid pulse develops.   You have  repeated fainting or seizure-like jerking during an episode.   You faint when sitting or lying down.   You develop confusion.   You have difficulty walking.   Severe weakness develops.   Vision problems develop.  MAKE SURE YOU:   Understand these instructions.   Will watch your condition.   Will get help right away if you are not doing well or get worse.  Document Released: 02/17/2005 Document Revised: 10/30/2010 Document Reviewed: 04/05/2010 [facility] Patient Information 2012 [place], [place].  Syncope You have had a fainting (syncopal) spell. A fainting episode is a sudden, short-lived loss of consciousness. It results in complete recovery. It occurs because there has been a temporary shortage of oxygen and/or sugar (glucose) to the brain. CAUSES   Blood pressure pills and other medications that may lower blood pressure below normal. Sudden changes in posture (sudden standing).   Over-medication. Take your medications as directed.   Standing too long. This can cause blood to pool in the legs.   Seizure disorders.   Low blood sugar (hypoglycemia) of diabetes. This more commonly causes coma.   Bearing down to go to the bathroom. This can cause your blood pressure to rise suddenly. Your body compensates by making the blood pressure too low when you stop bearing down.   Hardening of the arteries where the brain temporarily does not receive enough blood.   Irregular heart beat and circulatory problems.   Fear, emotional distress, injury, sight  of blood, or illness.  Your caregiver will send you home if the syncope was from non-worrisome causes (benign). Depending on your age and health, you may stay to be monitored and observed. If you return home, have someone stay with you if your caregiver feels that is desirable. It is very important to keep all follow-up referrals and appointments in order to properly manage this condition. This is a serious problem which can lead to  serious illness and death if not carefully managed.  WARNING: Do not drive or operate machinery until your caregiver feels that it is safe for you to do so. SEEK IMMEDIATE MEDICAL CARE IF:   You have another fainting episode or faint while lying or sitting down. DO NOT DRIVE YOURSELF. Call 911 if no other help is available.   You have chest pain, are feeling sick to your stomach (nausea), vomiting or abdominal pain.   You have an irregular heartbeat or one that is very fast (pulse over 120 beats per minute).   You have a loss of feeling in some part of your body or lose movement in your arms or legs.   You have difficulty with speech, confusion, severe weakness, or visual problems.   You become sweaty and/or feel light headed.  Make sure you are rechecked as instructed. Document Released: 02/17/2005 Document Revised: 10/30/2010 Document Reviewed: 10/08/2006 [facility] Patient Information 2012 [place], [place].

## 2011-04-19 NOTE — Progress Notes (Signed)
  Echocardiogram 2D Echocardiogram has been performed.  Mercy Moore 04/19/2011, 10:39 AM

## 2011-04-19 NOTE — Progress Notes (Signed)
2DEcho done. Call to Dr. Izola Price as she wrote DC home orders for pt. Pt is refusing to take Lisinopril/HCTZ as she took one dose yesterday morning and later passed out. Dr Izola Price states for pt NOT to take any blood pressure medication until directed by her PMD. Pt verbalized understanding. Dr. Izola Price states she will update chart.

## 2011-04-28 ENCOUNTER — Encounter: Payer: Self-pay | Admitting: Cardiovascular Disease

## 2011-04-28 ENCOUNTER — Ambulatory Visit (INDEPENDENT_AMBULATORY_CARE_PROVIDER_SITE_OTHER): Payer: 59 | Admitting: Cardiovascular Disease

## 2011-04-28 VITALS — BP 122/88 | HR 88 | Wt 180.1 lb

## 2011-04-28 DIAGNOSIS — I10 Essential (primary) hypertension: Secondary | ICD-10-CM

## 2011-04-28 DIAGNOSIS — R55 Syncope and collapse: Secondary | ICD-10-CM

## 2011-04-28 DIAGNOSIS — R002 Palpitations: Secondary | ICD-10-CM

## 2011-04-28 MED ORDER — HYDROCHLOROTHIAZIDE 25 MG PO TABS: 25.0000 mg | ORAL_TABLET | Freq: Every day | ORAL | Status: DC

## 2011-04-28 NOTE — Assessment & Plan Note (Signed)
Will resume HCTZ at 25 mg po Qdaily.

## 2011-04-28 NOTE — Assessment & Plan Note (Signed)
The etiology of her syncopal event is unknown. Most likely vasovagal but it could have been related to hypotension with her new BP medication. ? relation to seizure activity. Her resting heart rate has been over 100 and she has noticed palpitations. Will get 21 day event monitor to exclude arrythmias.

## 2011-04-28 NOTE — Progress Notes (Signed)
   History of Present Illness: 41 yo WF with history of HTN, seizure disorder here today for cardiac evaluation. She was admitted to [facility] 04/18/11 after a syncopal episode. This was felt to be related to orthostasis. Echo during hospitalization showed normal LV function. Her labwork was normal including TSH. Head CT normal. She reports that co-workers thought she had a seizure. No post-ictal state. She had begun taking a new blood pressure pill the am of her syncopal event. (Lisinopril 10/HCTZ 12.5mg ). Since leaving the hospital on 04/20/11, she has felt better but her BP has been elevated off of all therapy. The also notes tachycardia on her home BP cuff and some palpitations. No chest pain or SOB. She has gained weight recently and wonders if she is beginning menopause.   Primary Care Physician: Johny Blamer   Past Medical History  Diagnosis Date  . Hypertension   . Seizures 04/18/2011  . Arthritis     bilateral knees  . PONV (postoperative nausea and vomiting)     Past Surgical History  Procedure Date  . Knee surgery     1 on left, 2 on the right knee  . Cesarean section     x 4  . Tubal ligation   . Nasal sinus surgery     Current Outpatient Prescriptions  Medication Sig Dispense Refill  . albuterol (PROVENTIL HFA;VENTOLIN HFA) 108 (90 BASE) MCG/ACT inhaler Inhale 2 puffs into the lungs every 6 (six) hours as needed.        No Known Allergies  History   Social History  . Marital Status: Married    Spouse Name: N/A    Number of Children: 4  . Years of Education: N/A   Occupational History  . [employer]    Social History Main Topics  . Smoking status: Former Smoker    Types: Cigarettes    Quit date: 04/18/1991  . Smokeless tobacco: Never Used  . Alcohol Use: No  . Drug Use: No  . Sexually Active: Yes    Birth Control/ Protection: Other-see comments     tubal ligation   Other Topics Concern  . Not on file   Social History Narrative  . No  narrative on file    Family History  Problem Relation Age of Onset  . Stroke Paternal Grandmother   . Heart attack Paternal Grandfather     Review of Systems:  As stated in the HPI and otherwise negative.   BP 122/88  Pulse 88  Wt 180 lb 1.9 oz (81.702 kg)  LMP 03/13/2011  Physical Examination: General: Well developed, well nourished, NAD HEENT: OP clear, mucus membranes moist SKIN: warm, dry. No rashes. Neuro: No focal deficits Musculoskeletal: Muscle strength 5/5 all ext Psychiatric: Mood and affect normal Neck: No JVD, no carotid bruits, no thyromegaly, no lymphadenopathy. Lungs:Clear bilaterally, no wheezes, rhonci, crackles Cardiovascular: Regular rate and rhythm. No murmurs, gallops or rubs. Abdomen:Soft. Bowel sounds present. Non-tender.  Extremities: No lower extremity edema. Pulses are 2 + in the bilateral DP/PT.  Echo: 04/19/11: Left ventricle: The cavity size was normal. Wall thickness was normal. Systolic function was normal. The estimated ejection fraction was in the range of 60% to 65%. Wall motion was normal; there were no regional wall motion abnormalities. Left ventricular diastolic function parameters were normal. - Atrial septum: No defect or patent foramen ovale was identified.

## 2011-04-28 NOTE — Patient Instructions (Signed)
Your physician recommends that you schedule a follow-up appointment in: 4 weeks.   Your physician has recommended that you wear an event monitor. Event monitors are medical devices that record the heart's electrical activity. Doctors most often [place] these monitors to diagnose arrhythmias. Arrhythmias are problems with the speed or rhythm of the heartbeat. The monitor is a small, portable device. You can wear one while you do your normal daily activities. This is usually used to diagnose what is causing palpitations/syncope (passing out).   Your physician has recommended you make the following change in your medication: Start hydrochlorothiazide 25 mg by mouth daily

## 2011-05-01 ENCOUNTER — Encounter (INDEPENDENT_AMBULATORY_CARE_PROVIDER_SITE_OTHER): Payer: 59

## 2011-05-01 DIAGNOSIS — R55 Syncope and collapse: Secondary | ICD-10-CM

## 2011-06-05 ENCOUNTER — Ambulatory Visit: Payer: 59 | Admitting: Cardiovascular Disease

## 2011-06-11 ENCOUNTER — Ambulatory Visit: Payer: 59 | Admitting: Cardiovascular Disease

## 2011-07-02 HISTORY — PX: NECK SURGERY: SHX720

## 2011-08-11 ENCOUNTER — Other Ambulatory Visit: Payer: Self-pay | Admitting: Obstetrics and Gynecology

## 2011-08-13 ENCOUNTER — Other Ambulatory Visit: Payer: Self-pay | Admitting: Obstetrics and Gynecology

## 2011-08-13 DIAGNOSIS — N6009 Solitary cyst of unspecified breast: Secondary | ICD-10-CM

## 2011-08-13 DIAGNOSIS — N644 Mastodynia: Secondary | ICD-10-CM

## 2011-08-20 ENCOUNTER — Ambulatory Visit
Admission: RE | Admit: 2011-08-20 | Discharge: 2011-08-20 | Disposition: A | Discharge status: Home / Self Care | Payer: 59 | Source: Ambulatory Visit | Attending: Obstetrics and Gynecology | Admitting: Obstetrics and Gynecology

## 2011-08-20 DIAGNOSIS — N644 Mastodynia: Secondary | ICD-10-CM

## 2011-08-20 DIAGNOSIS — N6009 Solitary cyst of unspecified breast: Secondary | ICD-10-CM

## 2012-02-12 ENCOUNTER — Encounter: Payer: Self-pay | Admitting: Obstetrics and Gynecology

## 2012-03-04 ENCOUNTER — Encounter: Payer: Self-pay | Admitting: Obstetrics and Gynecology

## 2012-03-04 ENCOUNTER — Ambulatory Visit (INDEPENDENT_AMBULATORY_CARE_PROVIDER_SITE_OTHER): Payer: 59 | Admitting: Obstetrics and Gynecology

## 2012-03-04 VITALS — BP 130/100 | Ht 62.0 in | Wt 180.0 lb

## 2012-03-04 DIAGNOSIS — Z780 Asymptomatic menopausal state: Secondary | ICD-10-CM

## 2012-03-04 DIAGNOSIS — N920 Excessive and frequent menstruation with regular cycle: Secondary | ICD-10-CM

## 2012-03-04 LAB — CBC
HCT: 41 % (ref 36.0–46.0)
MCH: 30.3 pg (ref 26.0–34.0)
MCHC: 33.9 g/dL (ref 30.0–36.0)
MCV: 89.3 fL (ref 78.0–100.0)
Platelets: 260 10*3/uL (ref 150–400)
RDW: 13.7 % (ref 11.5–15.5)
WBC: 6 10*3/uL (ref 4.0–10.5)

## 2012-03-04 LAB — COMPREHENSIVE METABOLIC PANEL
ALT: 10 U/L (ref 0–35)
BUN: 7 mg/dL (ref 6–23)
CO2: 27 mEq/L (ref 19–32)
Calcium: 9.9 mg/dL (ref 8.4–10.5)
Chloride: 103 mEq/L (ref 96–112)
Creat: 0.75 mg/dL (ref 0.50–1.10)
Glucose, Bld: 99 mg/dL (ref 70–99)
Total Bilirubin: 0.3 mg/dL (ref 0.3–1.2)

## 2012-03-04 LAB — FOLLICLE STIMULATING HORMONE: FSH: 13.2 m[IU]/mL

## 2012-03-04 NOTE — Progress Notes (Signed)
NEW GYNECOLOGIC EXAMINATION  [name] is an 42 y.o. female, G4P4, who presents to the [facility] division of [facility] for a new patient gynecologic examination. She has not been seen in this office and greater than 3 years.  Last Pap smear was in March of 2013.  She complains of heavy menstrual cycles.  She is starting to have some menopausal symptoms.  She declined STD testing. She has had a tubal ligation.  Past Medical History  Diagnosis Date  . Hypertension   . Seizures 04/18/2011  . Arthritis     bilateral knees  . PONV (postoperative nausea and vomiting)     Past Surgical History  Procedure Date  . Knee surgery     1 on left, 2 on the right knee  . Cesarean section     x 4  . Tubal ligation   . Nasal sinus surgery     Family History  Problem Relation Age of Onset  . Stroke Paternal Grandmother   . Heart attack Paternal Grandfather     Social History:  reports that she quit smoking about 20 years ago. Her smoking use included Cigarettes. She has never used smokeless tobacco. She reports that she does not drink alcohol or use illicit drugs.  Allergies: No Known Allergies  Medications: hydrochlorothiazide, albuterol  Review of Systems:  See history of present illness and gynecologic history.  Physical Examination:  Blood pressure 130/100, height 5\' 2"  (1.575 m), weight 180 lb (81.647 kg), last menstrual period 03/02/2012. Body mass index is 32.92 kg/(m^2).  General: alert and no distress Resp: clear to auscultation bilaterally Cardio: regular rate and rhythm, S1, S2 normal, no murmur, click, rub or gallop GI: soft, non-tender; bowel sounds normal; no masses,  no organomegaly  External genitalia: normal general appearance Vagina: No masses or lesions, relaxation: yes Cervix: Nontender, No lesions Uterus: Normal size, shape, and consistency Adnexa: No masses, nontender Rectovaginal exam:Deferred  No results found  for this or any previous visit (from the past 48 hour(s)).  Wet Prep: None  Urine Pregnancy Test: None  Assessment:  Normal female examination  Overweight or obese: Yes   Pelvic relaxation: Yes  Menometrorrhagia  Hypertension  Asthma   Plan:    CBC, comprehensive panel, TSH, prolactin  GYN ultrasound  Return to office in 2 weeks  Menopause and irregular cycles discussed.  Leonard Schwartz, M.D. 03/04/2012

## 2012-03-09 ENCOUNTER — Telehealth: Payer: Self-pay | Admitting: Obstetrics and Gynecology

## 2012-03-09 NOTE — Telephone Encounter (Signed)
AVS to sign off of results.

## 2012-03-12 ENCOUNTER — Other Ambulatory Visit: Payer: Self-pay

## 2012-03-12 ENCOUNTER — Telehealth: Payer: Self-pay

## 2012-03-12 DIAGNOSIS — N920 Excessive and frequent menstruation with regular cycle: Secondary | ICD-10-CM

## 2012-03-12 NOTE — Telephone Encounter (Signed)
Tc to pt per test results. Told pt CBC, Prolactin, TSH, and FSH,CMP-all wnl. Pt voices understanding.

## 2012-03-16 ENCOUNTER — Telehealth: Payer: Self-pay

## 2012-03-16 NOTE — Telephone Encounter (Signed)
AVS needs to sign off pt results.  ALL LABS are Within Normal limits.   [facility] CMA

## 2012-03-29 ENCOUNTER — Encounter: Payer: 59 | Admitting: Obstetrics and Gynecology

## 2012-04-01 ENCOUNTER — Encounter: Payer: 59 | Admitting: Obstetrics and Gynecology

## 2012-04-01 ENCOUNTER — Other Ambulatory Visit: Payer: 59

## 2012-04-01 ENCOUNTER — Other Ambulatory Visit: Payer: Self-pay | Admitting: Obstetrics and Gynecology

## 2012-04-01 DIAGNOSIS — N920 Excessive and frequent menstruation with regular cycle: Secondary | ICD-10-CM

## 2012-06-11 ENCOUNTER — Other Ambulatory Visit: Payer: Self-pay | Admitting: Orthopaedic Surgery

## 2012-06-11 DIAGNOSIS — M545 Low back pain: Secondary | ICD-10-CM

## 2012-06-17 ENCOUNTER — Ambulatory Visit
Admission: RE | Admit: 2012-06-17 | Discharge: 2012-06-17 | Disposition: A | Discharge status: Home / Self Care | Payer: 59 | Source: Ambulatory Visit | Attending: Orthopaedic Surgery | Admitting: Orthopaedic Surgery

## 2012-06-17 DIAGNOSIS — M545 Low back pain: Secondary | ICD-10-CM

## 2013-05-16 ENCOUNTER — Other Ambulatory Visit: Payer: Self-pay | Admitting: Orthopedic Surgery

## 2013-05-16 DIAGNOSIS — M25561 Pain in right knee: Secondary | ICD-10-CM

## 2013-05-22 ENCOUNTER — Other Ambulatory Visit: Payer: 59

## 2013-07-22 ENCOUNTER — Emergency Department (HOSPITAL_COMMUNITY): Payer: 59

## 2013-07-22 ENCOUNTER — Emergency Department (HOSPITAL_COMMUNITY)
Admission: EM | Admit: 2013-07-22 | Discharge: 2013-07-22 | Disposition: A | Discharge status: Home / Self Care | Payer: 59 | Attending: Emergency Medicine | Admitting: Emergency Medicine

## 2013-07-22 ENCOUNTER — Encounter (HOSPITAL_COMMUNITY): Payer: Self-pay | Admitting: Emergency Medicine

## 2013-07-22 DIAGNOSIS — T148XXA Other injury of unspecified body region, initial encounter: Secondary | ICD-10-CM | POA: Diagnosis present

## 2013-07-22 DIAGNOSIS — Y9389 Activity, other specified: Secondary | ICD-10-CM | POA: Diagnosis not present

## 2013-07-22 DIAGNOSIS — S0993XA Unspecified injury of face, initial encounter: Secondary | ICD-10-CM | POA: Insufficient documentation

## 2013-07-22 DIAGNOSIS — S99929A Unspecified injury of unspecified foot, initial encounter: Secondary | ICD-10-CM

## 2013-07-22 DIAGNOSIS — Z79899 Other long term (current) drug therapy: Secondary | ICD-10-CM | POA: Insufficient documentation

## 2013-07-22 DIAGNOSIS — S46909A Unspecified injury of unspecified muscle, fascia and tendon at shoulder and upper arm level, unspecified arm, initial encounter: Secondary | ICD-10-CM | POA: Diagnosis present

## 2013-07-22 DIAGNOSIS — Z3202 Encounter for pregnancy test, result negative: Secondary | ICD-10-CM | POA: Diagnosis not present

## 2013-07-22 DIAGNOSIS — T07XXXA Unspecified multiple injuries, initial encounter: Secondary | ICD-10-CM | POA: Diagnosis not present

## 2013-07-22 DIAGNOSIS — R1084 Generalized abdominal pain: Secondary | ICD-10-CM | POA: Diagnosis not present

## 2013-07-22 DIAGNOSIS — S3981XA Other specified injuries of abdomen, initial encounter: Secondary | ICD-10-CM | POA: Diagnosis not present

## 2013-07-22 DIAGNOSIS — I10 Essential (primary) hypertension: Secondary | ICD-10-CM | POA: Insufficient documentation

## 2013-07-22 DIAGNOSIS — IMO0002 Reserved for concepts with insufficient information to code with codable children: Secondary | ICD-10-CM | POA: Diagnosis not present

## 2013-07-22 DIAGNOSIS — Z87891 Personal history of nicotine dependence: Secondary | ICD-10-CM | POA: Diagnosis not present

## 2013-07-22 DIAGNOSIS — Z8739 Personal history of other diseases of the musculoskeletal system and connective tissue: Secondary | ICD-10-CM | POA: Insufficient documentation

## 2013-07-22 DIAGNOSIS — Y92410 Unspecified street and highway as the place of occurrence of the external cause: Secondary | ICD-10-CM | POA: Insufficient documentation

## 2013-07-22 DIAGNOSIS — S8990XA Unspecified injury of unspecified lower leg, initial encounter: Secondary | ICD-10-CM | POA: Insufficient documentation

## 2013-07-22 DIAGNOSIS — Z23 Encounter for immunization: Secondary | ICD-10-CM | POA: Diagnosis not present

## 2013-07-22 DIAGNOSIS — S99919A Unspecified injury of unspecified ankle, initial encounter: Secondary | ICD-10-CM

## 2013-07-22 DIAGNOSIS — S4980XA Other specified injuries of shoulder and upper arm, unspecified arm, initial encounter: Secondary | ICD-10-CM | POA: Diagnosis present

## 2013-07-22 DIAGNOSIS — S199XXA Unspecified injury of neck, initial encounter: Secondary | ICD-10-CM

## 2013-07-22 LAB — CBC
HCT: 38.9 % (ref 36.0–46.0)
Hemoglobin: 13.3 g/dL (ref 12.0–15.0)
MCH: 30.1 pg (ref 26.0–34.0)
MCHC: 34.2 g/dL (ref 30.0–36.0)
MCV: 88 fL (ref 78.0–100.0)
PLATELETS: 251 10*3/uL (ref 150–400)
RBC: 4.42 MIL/uL (ref 3.87–5.11)
RDW: 12.8 % (ref 11.5–15.5)
WBC: 6.5 10*3/uL (ref 4.0–10.5)

## 2013-07-22 LAB — COMPREHENSIVE METABOLIC PANEL
ALBUMIN: 4.6 g/dL (ref 3.5–5.2)
ALT: 15 U/L (ref 0–35)
AST: 20 U/L (ref 0–37)
Alkaline Phosphatase: 57 U/L (ref 39–117)
BILIRUBIN TOTAL: 0.5 mg/dL (ref 0.3–1.2)
BUN: 9 mg/dL (ref 6–23)
CHLORIDE: 101 meq/L (ref 96–112)
CO2: 20 mEq/L (ref 19–32)
CREATININE: 0.7 mg/dL (ref 0.50–1.10)
Calcium: 10 mg/dL (ref 8.4–10.5)
GFR calc non Af Amer: >90 mL/min (ref >90)
Glucose, Bld: 117 mg/dL — ABNORMAL HIGH (ref 70–99)
Potassium: 3 mEq/L — ABNORMAL LOW (ref 3.7–5.3)
Sodium: 139 mEq/L (ref 137–147)
Total Protein: 7.8 g/dL (ref 6.0–8.3)

## 2013-07-22 LAB — PROTIME-INR
INR: 0.89 (ref 0.00–1.49)
PROTHROMBIN TIME: 11.9 s (ref 11.6–15.2)

## 2013-07-22 LAB — ETHANOL

## 2013-07-22 LAB — POC URINE PREG, ED: PREG TEST UR: NEGATIVE

## 2013-07-22 MED ORDER — SODIUM CHLORIDE 0.9 % IV BOLUS (SEPSIS)
1000.0000 mL | INTRAVENOUS | Status: AC
  Administered 2013-07-22 (×2): 1000 mL via INTRAVENOUS

## 2013-07-22 MED ORDER — HYDROMORPHONE HCL PF 1 MG/ML IJ SOLN
1.0000 mg | Freq: Once | INTRAMUSCULAR | Status: AC
  Administered 2013-07-22: 1 mg via INTRAVENOUS
  Filled 2013-07-22: qty 1

## 2013-07-22 MED ORDER — OXYCODONE-ACETAMINOPHEN 5-325 MG PO TABS: 1.0000 | ORAL_TABLET | Freq: Four times a day (QID) | ORAL | Status: DC | PRN

## 2013-07-22 MED ORDER — CYCLOBENZAPRINE HCL 10 MG PO TABS: 10.0000 mg | ORAL_TABLET | Freq: Two times a day (BID) | ORAL | Status: AC | PRN

## 2013-07-22 MED ORDER — HYDROMORPHONE HCL PF 1 MG/ML IJ SOLN
1.0000 mg | Freq: Once | INTRAMUSCULAR | Status: AC
Start: 2013-07-22 — End: 2013-07-22
  Administered 2013-07-22: 1 mg via INTRAVENOUS
  Filled 2013-07-22: qty 1

## 2013-07-22 MED ORDER — TETANUS-DIPHTH-ACELL PERTUSSIS 5-2.5-18.5 LF-MCG/0.5 IM SUSP
0.5000 mL | Freq: Once | INTRAMUSCULAR | Status: AC
  Administered 2013-07-22: 0.5 mL via INTRAMUSCULAR
  Filled 2013-07-22: qty 0.5

## 2013-07-22 MED ORDER — IOHEXOL 300 MG/ML  SOLN
100.0000 mL | Freq: Once | INTRAMUSCULAR | Status: AC | PRN
  Administered 2013-07-22: 100 mL via INTRAVENOUS

## 2013-07-22 NOTE — ED Notes (Signed)
MD Harrison at bedside. 
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
## 2013-07-22 NOTE — ED Notes (Signed)
Pt still in CT scan.

## 2013-07-22 NOTE — ED Notes (Signed)
Patient transported to X-ray 

## 2013-07-22 NOTE — ED Notes (Signed)
Reviewed medications and referred to a 24 hour pharmacy. Patient taken to car in a wheelchair. Family escorting patient home.

## 2013-07-22 NOTE — ED Notes (Signed)
Patient transported to CT 
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
## 2013-07-22 NOTE — ED Notes (Signed)
Md at bedside
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
## 2014-01-02 ENCOUNTER — Encounter (HOSPITAL_COMMUNITY): Payer: Self-pay | Admitting: Emergency Medicine

## 2014-03-08 ENCOUNTER — Other Ambulatory Visit: Payer: Self-pay | Admitting: Obstetrics and Gynecology

## 2014-03-09 ENCOUNTER — Other Ambulatory Visit: Payer: Self-pay | Admitting: Obstetrics and Gynecology

## 2014-03-14 ENCOUNTER — Encounter (HOSPITAL_COMMUNITY): Payer: Self-pay | Admitting: *Deleted

## 2014-03-15 ENCOUNTER — Other Ambulatory Visit: Payer: Self-pay | Admitting: Obstetrics and Gynecology

## 2014-03-15 NOTE — H&P (Signed)
Admission History and Physical Exam for a Gynecology Patient  Ms. Michele MaplesJanice L Camacho is a 44 y.o. female, G4P4, who presents for hysteroscopy, D&C, and NovaSure ablation of the endometrium.  The patient has a history of menorrhagia.  She began bleeding in November 2015 and has had irregular bleeding since.  She was treated with Provera 10 mg daily.  She continued to have irregular bleeding.  Screening test for sexual transmitted infections were negative.  Her hemoglobin was 14.2.  Her endometrial biopsy was negative.  An ultrasound showed a 5 x 5 cm uterus.  The endometrium measures 7 mm.  No adnexal masses were appreciated. [facility]  OB History    Gravida Para Term Preterm AB TAB SAB Ectopic Multiple Living   4 4              Past Medical History  Diagnosis Date  . Hypertension   . Seizures 04/18/2011  . Arthritis     bilateral knees  . PONV (postoperative nausea and vomiting)   . Pneumonia     uses inhaler when sick  . Fibromyalgia     No prescriptions prior to admission    Past Surgical History  Procedure Laterality Date  . Knee surgery      1 on left, 2 on the right knee  . Cesarean section      x 4  . Tubal ligation    . Nasal sinus surgery    . Wisdom tooth extraction    . Tonsillectomy    . Neck surgery  07/2011    rod    Allergies  Allergen Reactions  . Penicillins Other (See Comments)    "I dont know what happens, i haven't had it in so long."    Family History: family history includes Heart attack in her paternal grandfather; Stroke in her paternal grandmother.  Social History:  reports that she quit smoking about 22 years ago. Her smoking use included Cigarettes. She has never used smokeless tobacco. She reports that she does not drink alcohol or use illicit drugs.  Review of systems: See HPI.  Admission Physical Exam:    Body mass index is 29.26  kg/(m^2).  Height 5\' 2"  (1.575 m), weight 160 lb (72.576 kg).  HEENT:                 Within normal limits Chest:                   Clear Heart:                    Regular rate and rhythm Breasts:                No masses, skin changes, bleeding, or discharge present Abdomen:             Nontender, no masses Extremities:          Grossly normal Neurologic exam: Grossly normal  Pelvic exam:  External genitalia: normal general appearance Vaginal: normal without tenderness, induration or masses Cervix: normal appearance Adnexa: normal bimanual exam Uterus: normal size, shape, and consistency  Assessment:  Menorrhagia  Irregular bleeding  Hypertension  Fibromyalgia  Plan: The  The patient will undergo hysteroscopy, D&C, and NovaSure ablation of endometrium.  The patient understands the indications for her surgical procedure.  She understands the alternative treatment options.  She accepts the risk of,  but not limited to, anesthetic complications, bleeding, infection, and possible damage to surrounding organs.   Janine Limbo 03/15/2014

## 2014-03-15 NOTE — Anesthesia Preprocedure Evaluation (Addendum)
Anesthesia Evaluation  Patient identified by MRN, date of birth, ID band Patient awake    Reviewed: Allergy & Precautions, NPO status , Patient's Chart, lab work & pertinent test results  History of Anesthesia Complications (+) PONV and history of anesthetic complications  Airway Mallampati: II  TM Distance: >3 FB Neck ROM: Full    Dental no notable dental hx. (+) Dental Advisory Given   Pulmonary former smoker,  breath sounds clear to auscultation  Pulmonary exam normal       Cardiovascular Exercise Tolerance: Good hypertension, Rhythm:Regular Rate:Normal     Neuro/Psych Seizures -,  negative psych ROS   GI/Hepatic negative GI ROS, Neg liver ROS,   Endo/Other  negative endocrine ROS  Renal/GU negative Renal ROS  negative genitourinary   Musculoskeletal  (+) Arthritis -, Fibromyalgia -, narcotic dependent  Abdominal   Peds negative pediatric ROS (+)  Hematology negative hematology ROS (+)   Anesthesia Other Findings   Reproductive/Obstetrics negative OB ROS                            Anesthesia Physical Anesthesia Plan  ASA: II  Anesthesia Plan: General   Post-op Pain Management:    Induction: Intravenous  Airway Management Planned: LMA  Additional Equipment:   Intra-op Plan:   Post-operative Plan: Extubation in OR  Informed Consent: I have reviewed the patients History and Physical, chart, labs and discussed the procedure including the risks, benefits and alternatives for the proposed anesthesia with the patient or authorized representative who has indicated his/her understanding and acceptance.   Dental advisory given  Plan Discussed with: CRNA  Anesthesia Plan Comments:         Anesthesia Quick Evaluation

## 2014-03-16 ENCOUNTER — Ambulatory Visit (HOSPITAL_COMMUNITY)
Admission: RE | Admit: 2014-03-16 | Discharge: 2014-03-16 | Disposition: A | Discharge status: Home / Self Care | Payer: 59 | Source: Ambulatory Visit | Attending: Obstetrics and Gynecology | Admitting: Obstetrics and Gynecology

## 2014-03-16 ENCOUNTER — Encounter (HOSPITAL_COMMUNITY)
Admission: RE | Disposition: A | Discharge status: Home / Self Care | Payer: Self-pay | Source: Ambulatory Visit | Attending: Obstetrics and Gynecology

## 2014-03-16 ENCOUNTER — Ambulatory Visit (HOSPITAL_COMMUNITY): Payer: 59 | Admitting: Anesthesiology

## 2014-03-16 ENCOUNTER — Encounter (HOSPITAL_COMMUNITY): Payer: Self-pay | Admitting: *Deleted

## 2014-03-16 DIAGNOSIS — J45909 Unspecified asthma, uncomplicated: Secondary | ICD-10-CM | POA: Diagnosis not present

## 2014-03-16 DIAGNOSIS — I10 Essential (primary) hypertension: Secondary | ICD-10-CM | POA: Insufficient documentation

## 2014-03-16 DIAGNOSIS — M13861 Other specified arthritis, right knee: Secondary | ICD-10-CM | POA: Diagnosis not present

## 2014-03-16 DIAGNOSIS — M13862 Other specified arthritis, left knee: Secondary | ICD-10-CM | POA: Diagnosis not present

## 2014-03-16 DIAGNOSIS — N920 Excessive and frequent menstruation with regular cycle: Secondary | ICD-10-CM | POA: Insufficient documentation

## 2014-03-16 DIAGNOSIS — M797 Fibromyalgia: Secondary | ICD-10-CM | POA: Insufficient documentation

## 2014-03-16 DIAGNOSIS — N72 Inflammatory disease of cervix uteri: Secondary | ICD-10-CM | POA: Diagnosis not present

## 2014-03-16 DIAGNOSIS — N925 Other specified irregular menstruation: Secondary | ICD-10-CM | POA: Diagnosis present

## 2014-03-16 HISTORY — PX: DILITATION & CURRETTAGE/HYSTROSCOPY WITH NOVASURE ABLATION: SHX5568

## 2014-03-16 HISTORY — DX: Fibromyalgia: M79.7

## 2014-03-16 HISTORY — DX: Pneumonia, unspecified organism: J18.9

## 2014-03-16 LAB — CBC
HCT: 35.5 % — ABNORMAL LOW (ref 36.0–46.0)
HEMOGLOBIN: 12.1 g/dL (ref 12.0–15.0)
MCH: 30.7 pg (ref 26.0–34.0)
MCHC: 34.1 g/dL (ref 30.0–36.0)
MCV: 90.1 fL (ref 78.0–100.0)
Platelets: 294 10*3/uL (ref 150–400)
RBC: 3.94 MIL/uL (ref 3.87–5.11)
RDW: 13 % (ref 11.5–15.5)
WBC: 5.3 10*3/uL (ref 4.0–10.5)

## 2014-03-16 LAB — PREGNANCY, URINE: PREG TEST UR: NEGATIVE

## 2014-03-16 SURGERY — DILATATION & CURETTAGE/HYSTEROSCOPY WITH NOVASURE ABLATION
Anesthesia: General | Site: Vagina

## 2014-03-16 MED ORDER — KETOROLAC TROMETHAMINE 30 MG/ML IJ SOLN
INTRAMUSCULAR | Status: AC
  Filled 2014-03-16: qty 1

## 2014-03-16 MED ORDER — LACTATED RINGERS IV SOLN
INTRAVENOUS | Status: DC | PRN
  Administered 2014-03-16: 1 via INTRAVENOUS

## 2014-03-16 MED ORDER — FENTANYL CITRATE 0.05 MG/ML IJ SOLN
INTRAMUSCULAR | Status: AC
  Administered 2014-03-16: 50 ug via INTRAVENOUS
  Filled 2014-03-16: qty 2

## 2014-03-16 MED ORDER — LACTATED RINGERS IV SOLN
INTRAVENOUS | Status: DC
  Administered 2014-03-16 (×2): via INTRAVENOUS

## 2014-03-16 MED ORDER — METOCLOPRAMIDE HCL 5 MG/ML IJ SOLN
10.0000 mg | Freq: Once | INTRAMUSCULAR | Status: AC
  Administered 2014-03-16: 10 mg via INTRAVENOUS

## 2014-03-16 MED ORDER — SCOPOLAMINE 1 MG/3DAYS TD PT72
1.0000 | MEDICATED_PATCH | Freq: Once | TRANSDERMAL | Status: DC
  Administered 2014-03-16: 1.5 mg via TRANSDERMAL

## 2014-03-16 MED ORDER — FENTANYL CITRATE 0.05 MG/ML IJ SOLN
25.0000 ug | INTRAMUSCULAR | Status: DC | PRN
Start: 2014-03-16 — End: 2014-03-16
  Administered 2014-03-16: 50 ug via INTRAVENOUS
  Administered 2014-03-16: 25 ug via INTRAVENOUS
  Administered 2014-03-16 (×2): 50 ug via INTRAVENOUS

## 2014-03-16 MED ORDER — OXYCODONE-ACETAMINOPHEN 5-325 MG PO TABS: 1.0000 | ORAL_TABLET | ORAL | Status: DC | PRN

## 2014-03-16 MED ORDER — ACETAMINOPHEN 160 MG/5ML PO SOLN
325.0000 mg | ORAL | Status: DC | PRN
  Administered 2014-03-16: 650 mg via ORAL

## 2014-03-16 MED ORDER — DEXAMETHASONE SODIUM PHOSPHATE 10 MG/ML IJ SOLN
INTRAMUSCULAR | Status: DC | PRN
  Administered 2014-03-16: 4 mg via INTRAVENOUS

## 2014-03-16 MED ORDER — PROPOFOL 10 MG/ML IV BOLUS
INTRAVENOUS | Status: AC
  Filled 2014-03-16: qty 20

## 2014-03-16 MED ORDER — ACETAMINOPHEN 325 MG PO TABS: 325.0000 mg | ORAL_TABLET | ORAL | Status: DC | PRN

## 2014-03-16 MED ORDER — PROPOFOL 10 MG/ML IV BOLUS
INTRAVENOUS | Status: DC | PRN
  Administered 2014-03-16: 170 mg via INTRAVENOUS

## 2014-03-16 MED ORDER — OXYCODONE-ACETAMINOPHEN 5-325 MG PO TABS
ORAL_TABLET | ORAL | Status: AC
  Filled 2014-03-16: qty 1

## 2014-03-16 MED ORDER — DEXAMETHASONE SODIUM PHOSPHATE 4 MG/ML IJ SOLN
INTRAMUSCULAR | Status: AC
Start: 2014-03-16 — End: 2014-03-16
  Filled 2014-03-16: qty 1

## 2014-03-16 MED ORDER — FENTANYL CITRATE 0.05 MG/ML IJ SOLN
INTRAMUSCULAR | Status: DC | PRN
  Administered 2014-03-16 (×2): 50 ug via INTRAVENOUS

## 2014-03-16 MED ORDER — BUPIVACAINE-EPINEPHRINE (PF) 0.5% -1:200000 IJ SOLN
INTRAMUSCULAR | Status: AC
  Filled 2014-03-16: qty 30

## 2014-03-16 MED ORDER — FENTANYL CITRATE 0.05 MG/ML IJ SOLN
INTRAMUSCULAR | Status: AC
  Filled 2014-03-16: qty 2

## 2014-03-16 MED ORDER — LIDOCAINE HCL (CARDIAC) 20 MG/ML IV SOLN
INTRAVENOUS | Status: AC
Start: 2014-03-16 — End: 2014-03-16
  Filled 2014-03-16: qty 5

## 2014-03-16 MED ORDER — ONDANSETRON HCL 4 MG/2ML IJ SOLN
INTRAMUSCULAR | Status: AC
  Filled 2014-03-16: qty 2

## 2014-03-16 MED ORDER — IBUPROFEN 800 MG PO TABS: 800.0000 mg | ORAL_TABLET | Freq: Three times a day (TID) | ORAL | Status: DC | PRN

## 2014-03-16 MED ORDER — PROMETHAZINE HCL 12.5 MG PO TABS: 12.5000 mg | ORAL_TABLET | Freq: Four times a day (QID) | ORAL | Status: DC | PRN

## 2014-03-16 MED ORDER — ONDANSETRON HCL 4 MG/2ML IJ SOLN: 4.0000 mg | Freq: Once | INTRAMUSCULAR | Status: DC | PRN

## 2014-03-16 MED ORDER — LIDOCAINE HCL (CARDIAC) 20 MG/ML IV SOLN
INTRAVENOUS | Status: DC | PRN
  Administered 2014-03-16: 70 mg via INTRAVENOUS
  Administered 2014-03-16: 30 mg via INTRAVENOUS

## 2014-03-16 MED ORDER — BUPIVACAINE-EPINEPHRINE 0.5% -1:200000 IJ SOLN
INTRAMUSCULAR | Status: DC | PRN
  Administered 2014-03-16: 10 mL

## 2014-03-16 MED ORDER — ONDANSETRON HCL 4 MG/2ML IJ SOLN
INTRAMUSCULAR | Status: DC | PRN
  Administered 2014-03-16: 4 mg via INTRAVENOUS

## 2014-03-16 MED ORDER — MIDAZOLAM HCL 2 MG/2ML IJ SOLN
INTRAMUSCULAR | Status: DC | PRN
  Administered 2014-03-16: 1 mg via INTRAVENOUS

## 2014-03-16 MED ORDER — MIDAZOLAM HCL 2 MG/2ML IJ SOLN
INTRAMUSCULAR | Status: AC
  Filled 2014-03-16: qty 2

## 2014-03-16 MED ORDER — OXYCODONE-ACETAMINOPHEN 5-325 MG PO TABS
1.0000 | ORAL_TABLET | ORAL | Status: DC | PRN
  Administered 2014-03-16: 1 via ORAL

## 2014-03-16 MED ORDER — ACETAMINOPHEN 160 MG/5ML PO SOLN
ORAL | Status: AC
  Filled 2014-03-16: qty 20.3

## 2014-03-16 MED ORDER — SCOPOLAMINE 1 MG/3DAYS TD PT72
MEDICATED_PATCH | TRANSDERMAL | Status: AC
  Administered 2014-03-16: 1.5 mg via TRANSDERMAL
  Filled 2014-03-16: qty 1

## 2014-03-16 MED ORDER — KETOROLAC TROMETHAMINE 30 MG/ML IJ SOLN
INTRAMUSCULAR | Status: DC | PRN
  Administered 2014-03-16: 60 mg via INTRAVENOUS

## 2014-03-16 MED ORDER — METOCLOPRAMIDE HCL 5 MG/ML IJ SOLN
INTRAMUSCULAR | Status: AC
  Administered 2014-03-16: 10 mg via INTRAVENOUS
  Filled 2014-03-16: qty 2

## 2014-03-16 SURGICAL SUPPLY — 17 items
CANISTER SUCT 3000ML (MISCELLANEOUS) ×3 IMPLANT
CATH ROBINSON RED A/P 16FR (CATHETERS) ×3 IMPLANT
CLOTH BEACON ORANGE TIMEOUT ST (SAFETY) ×3 IMPLANT
CONTAINER PREFILL 10% NBF 60ML (FORM) ×6 IMPLANT
ELECT REM PT RETURN 9FT ADLT (ELECTROSURGICAL)
ELECTRODE REM PT RTRN 9FT ADLT (ELECTROSURGICAL) IMPLANT
GLOVE BIOGEL PI IND STRL 8.5 (GLOVE) ×1 IMPLANT
GLOVE BIOGEL PI INDICATOR 8.5 (GLOVE) ×2
GLOVE ECLIPSE 8.0 STRL XLNG CF (GLOVE) ×6 IMPLANT
GOWN STRL REUS W/TWL LRG LVL3 (GOWN DISPOSABLE) ×6 IMPLANT
LOOP ANGLED CUTTING 22FR (CUTTING LOOP) IMPLANT
PACK VAGINAL MINOR WOMEN LF (CUSTOM PROCEDURE TRAY) ×3 IMPLANT
PAD OB MATERNITY 4.3X12.25 (PERSONAL CARE ITEMS) ×3 IMPLANT
TOWEL OR 17X24 6PK STRL BLUE (TOWEL DISPOSABLE) ×6 IMPLANT
TUBING AQUILEX INFLOW (TUBING) ×3 IMPLANT
TUBING AQUILEX OUTFLOW (TUBING) ×3 IMPLANT
WATER STERILE IRR 1000ML POUR (IV SOLUTION) ×3 IMPLANT

## 2014-03-16 NOTE — H&P (Signed)
The patient was interviewed and examined today.  The previously documented history and physical examination was reviewed. There are no changes. The operative procedure was reviewed. The risks and benefits were outlined again. The specific risks include, but are not limited to, anesthetic complications, bleeding, infections, and possible damage to the surrounding organs. The patient's questions were answered.  We are ready to proceed as outlined. The likelihood of the patient achieving the goals of this procedure is very likely.   BP 145/79 mmHg  Pulse 88  Temp(Src) 98.6 F (37 C) (Oral)  Resp 20  Ht 5\' 2"  (1.575 m)  Wt 160 lb (72.576 kg)  BMI 29.26 kg/m2  SpO2 100%  LMP   Results for orders placed or performed during the hospital encounter of 03/16/14 (from the past 24 hour(s))  Pregnancy, urine     Status: None   Collection Time: 03/16/14  7:30 AM  Result Value Ref Range   Preg Test, Ur NEGATIVE NEGATIVE  CBC     Status: Abnormal   Collection Time: 03/16/14  7:47 AM  Result Value Ref Range   WBC 5.3 4.0 - 10.5 K/uL   RBC 3.94 3.87 - 5.11 MIL/uL   Hemoglobin 12.1 12.0 - 15.0 g/dL   HCT 29.535.5 (L) 62.136.0 - 30.846.0 %   MCV 90.1 78.0 - 100.0 fL   MCH 30.7 26.0 - 34.0 pg   MCHC 34.1 30.0 - 36.0 g/dL   RDW 65.713.0 84.611.5 - 96.215.5 %   Platelets 294 150 - 400 K/uL    Leonard SchwartzArthur Vernon Jahziel Sinn, M.D.

## 2014-03-16 NOTE — Op Note (Signed)
OPERATIVE NOTE  Michele Camacho  DOB:    10-25-1970  MRN:    [id number]  CSN:    [id number]  Date of Surgery:  03/16/2014  Preoperative Diagnosis:  Irregular uterine bleeding Menorrhagia Asthma  Postoperative Diagnosis:  Irregular uterine bleeding Menorrhagia Asthma  Procedure:  Hysteroscopy Dilatation and curettage NovaSure ablation of the endometrium  Surgeon:  Leonard SchwartzArthur Vernon Jarad Barth, M.D.  Assistant:  None  Anesthetic:  General  Disposition:  The patient is a 44 y.o.-year-old female who presents with irregular uterine bleeding and menorrhagia. She understands the indications for her surgical procedure. She accepts the risk of, but not limited to, anesthetic complications, bleeding, infections, and possible damage to the surrounding organs.  Findings:  On examination under anesthesia the uterus was 8 weeks size. No adnexal masses were appreciated. No parametrial disease was appreciated. The uterus sounded to 8 cm. The patient was noted to have no pathology by inspection.  Procedure:  The patient was taken to the operating room where a general anesthetic was given. The perineum and vagina were prepped with Betadine. The bladder was drained of urine. The patient was sterilely draped. Examination under anesthesia was performed. A paracervical block was placed using 10 cc of half percent Marcaine with epinephrine. An endocervical curettage was performed. The cervix was gently dilated. The diagnostic hysteroscope was inserted and the cavity was carefully inspected. Pictures were taken. Findings included: A normal endometrium and normal tubal ostia. The diagnostic hysteroscope was removed. The cervix was dilated further. The cavity was then curetted using a sharp curet. The cavity was felt to be clean at the end of our procedure. Hemostasis was adequate. The NovaSure instrument was tested and seemed to work properly. The cervical length was 3 cm. The cavity length was  noted to be 5 cm. The NovaSure instrument was inserted inside the uterus. The cavity width was noted to be 2.6 m. A final test was performed and the NovaSure instrument was noted to function properly and the cavity was noted to be intact. The endometrial cavity was then ablated for 1 minute and 39 seconds at a power of 72 W. All instruments were removed. The examination was repeated and the uterus was noted to be firm. Sponge, and needle counts were correct. The estimated blood loss for the procedure was 5 cc. The estimated fluid deficit loss 175 cc. Fluid was noted to be on the operating room floor, however. The patient was awakened from her anesthetic without difficulty. She was returned to the supine position and and transported to the recovery room in stable condition. The endocervical curettings, and endometrial curettings were sent to pathology.  Followup instructions:  The patient will return to see Dr. Stefano GaulStringer in 2 weeks. She was given a copy of the postoperative instructions for patients who've undergone endometrial ablation.  Discharge medications:  Motrin 800 mg every 8 hours as needed for mild to moderate pain. Percocet one tablet every 4 hours as needed for severe pain. Phenergan 12.5 mg every 6 hours as needed for nausea.  Leonard SchwartzArthur Vernon Jocelyne Reinertsen, M.D.

## 2014-03-16 NOTE — Transfer of Care (Signed)
Immediate Anesthesia Transfer of Care Note  Patient: Michele Camacho  Procedure(s) Performed: Procedure(s): DILATATION & CURETTAGE/HYSTEROSCOPY WITH NOVASURE ABLATION (N/A)  Patient Location: PACU  Anesthesia Type:General  Level of Consciousness: awake, alert , sedated and patient cooperative  Airway & Oxygen Therapy: Patient Spontanous Breathing and Patient connected to nasal cannula oxygen  Post-op Assessment: Report given to PACU RN and Post -op Vital signs reviewed and stable  Post vital signs: Reviewed and stable  Complications: No apparent anesthesia complications

## 2014-03-16 NOTE — Discharge Instructions (Signed)
Endometrial Ablation Endometrial ablation removes the lining of the uterus (endometrium). It is usually a same-day, outpatient treatment. Ablation helps avoid major surgery, such as surgery to remove the cervix and uterus (hysterectomy). After endometrial ablation, you will have little or no menstrual bleeding and may not be able to have children. However, if you are premenopausal, you will need to use a reliable method of birth control following the procedure because of the small chance that pregnancy can occur. There are different reasons to have this procedure, which include:  Heavy periods.  Bleeding that is causing anemia.  Irregular bleeding.  Bleeding fibroids on the lining inside the uterus if they are smaller than 3 centimeters. This procedure should not be done if:  You want children in the future.  You have severe cramps with your menstrual period.  You have precancerous or cancerous cells in your uterus.  You were recently pregnant.  You have gone through menopause.  You have had major surgery on the uterus, such as a cesarean delivery. LET [facility] CARE PROVIDER KNOW ABOUT:  Any allergies you have.  All medicines you are taking, including vitamins, herbs, eye drops, creams, and over-the-counter medicines.  Previous problems you or members of your family have had with the use of anesthetics.  Any blood disorders you have.  Previous surgeries you have had.  Medical conditions you have. RISKS AND COMPLICATIONS  Generally, this is a safe procedure. However, as with any procedure, complications can occur. Possible complications include:  Perforation of the uterus.  Bleeding.  Infection of the uterus, bladder, or vagina.  Injury to surrounding organs.  An air bubble to the lung (air embolus).  Pregnancy following the procedure.  Failure of the procedure to help the problem, requiring hysterectomy.  Decreased ability to diagnose cancer in the lining of  the uterus. BEFORE THE PROCEDURE  The lining of the uterus must be tested to make sure there is no pre-cancerous or cancer cells present.  An ultrasound may be performed to look at the size of the uterus and to check for abnormalities.  Medicines may be given to thin the lining of the uterus. PROCEDURE  During the procedure, your health care provider will use a tool called a resectoscope to help see inside your uterus. There are different ways to remove the lining of your uterus.   Radiofrequency - This method uses a radiofrequency-alternating electric current to remove the lining of the uterus.  Cryotherapy - This method uses extreme cold to freeze the lining of the uterus.  Heated-Free Liquid - This method uses heated salt (saline) solution to remove the lining of the uterus.  Microwave - This method uses high-energy microwaves to heat up the lining of the uterus to remove it.  Thermal balloon - This method involves inserting a catheter with a balloon tip into the uterus. The balloon tip is filled with heated fluid to remove the lining of the uterus. AFTER THE PROCEDURE  After your procedure, do not have sexual intercourse or insert anything into your vagina until permitted by your health care provider. After the procedure, you may experience:  Cramps.  Vaginal discharge.  Frequent urination. Document Released: 12/28/2003 Document Revised: 10/20/2012 Document Reviewed: 07/21/2012 [facility] Patient Information 2015 [place], [place]. This information is not intended to replace advice given to you by your health care provider. Make sure you discuss any questions you have with your health care provider.   DISCHARGE INSTRUCTIONS: HYSTEROSCOPY / ENDOMETRIAL ABLATION The following instructions have been  prepared to help you care for yourself upon your return home.  Personal hygiene:  Use sanitary pads for vaginal drainage, not tampons.  Shower the day after your procedure.  NO tub  baths, pools or Jacuzzis for 2-3 weeks.  Wipe front to back after using the bathroom.  Activity and limitations:  Do NOT drive or operate any equipment for 24 hours. The effects of anesthesia are still present and drowsiness may result.  Do NOT rest in bed all day.  Walking is encouraged.  Walk up and down stairs slowly.  You may resume your normal activity in one to two days or as indicated by your physician. Sexual activity: NO intercourse for at least 2 weeks after the procedure, or as indicated by your Doctor.  Diet: Eat a light meal as desired this evening. You may resume your usual diet tomorrow.  Return to Work: You may resume your work activities in one to two days or as indicated by Therapist, sportsyour Doctor.  What to expect after your surgery: Expect to have vaginal bleeding/discharge for 2-3 days and spotting for up to 10 days. It is not unusual to have soreness for up to 1-2 weeks. You may have a slight burning sensation when you urinate for the first day. Mild cramps may continue for a couple of days. You may have a regular period in 2-6 weeks.  NO IBUPROFEN PRODUCTS (MOTRIN, ADVIL) OR ALEVE UNTIL 3:00pm TODAY.   Call your doctor for any of the following:  Excessive vaginal bleeding or clotting, saturating and changing one pad every hour.  Inability to urinate 6 hours after discharge from hospital.  Pain not relieved by pain medication.  Fever of 100.4 F or greater.  Unusual vaginal discharge or odor.  Return to office _________________Call for an appointment ___________________ Patients signature: ______________________ Nurses signature ________________________  Post Anesthesia Care Unit [phone]

## 2014-03-16 NOTE — Anesthesia Postprocedure Evaluation (Signed)
  Anesthesia Post-op Note  Patient: Michele Camacho  Procedure(s) Performed: Procedure(s) (LRB): DILATATION & CURETTAGE/HYSTEROSCOPY WITH NOVASURE ABLATION (N/A)  Patient Location: PACU  Anesthesia Type: General  Level of Consciousness: awake and alert   Airway and Oxygen Therapy: Patient Spontanous Breathing  Post-op Pain: mild  Post-op Assessment: Post-op Vital signs reviewed, Patient's Cardiovascular Status Stable, Respiratory Function Stable, Patent Airway and No signs of Nausea or vomiting  Last Vitals:  Filed Vitals:   03/16/14 0939  BP:   Pulse: 66  Temp:   Resp: 34    Post-op Vital Signs: stable   Complications: No apparent anesthesia complications

## 2014-03-16 NOTE — Anesthesia Procedure Notes (Signed)
Procedure Name: LMA Insertion Date/Time: 03/16/2014 8:34 AM Performed by: Suella GroveMOORE, Anamaria Dusenbury C Pre-anesthesia Checklist: Patient identified, Patient being monitored, Emergency Drugs available, Timeout performed and Suction available Patient Re-evaluated:Patient Re-evaluated prior to inductionOxygen Delivery Method: Circle system utilized and Simple face mask Preoxygenation: Pre-oxygenation with 100% oxygen Intubation Type: IV induction Ventilation: Mask ventilation without difficulty LMA: LMA inserted LMA Size: 4.0 Grade View: Grade II Placement Confirmation: breath sounds checked- equal and bilateral Dental Injury: Teeth and Oropharynx as per pre-operative assessment

## 2014-03-17 ENCOUNTER — Encounter (HOSPITAL_COMMUNITY): Payer: Self-pay | Admitting: Obstetrics and Gynecology

## 2014-10-26 ENCOUNTER — Other Ambulatory Visit: Payer: Self-pay | Admitting: Physician Assistant

## 2014-10-26 ENCOUNTER — Ambulatory Visit
Admission: RE | Admit: 2014-10-26 | Discharge: 2014-10-26 | Disposition: A | Discharge status: Home / Self Care | Payer: 59 | Source: Ambulatory Visit | Attending: Physician Assistant | Admitting: Physician Assistant

## 2014-10-26 DIAGNOSIS — S6992XA Unspecified injury of left wrist, hand and finger(s), initial encounter: Secondary | ICD-10-CM

## 2015-08-09 ENCOUNTER — Emergency Department (HOSPITAL_COMMUNITY): Payer: 59

## 2015-08-09 ENCOUNTER — Emergency Department (HOSPITAL_COMMUNITY)
Admission: EM | Admit: 2015-08-09 | Discharge: 2015-08-09 | Disposition: A | Discharge status: Home / Self Care | Payer: 59 | Attending: Emergency Medicine | Admitting: Emergency Medicine

## 2015-08-09 ENCOUNTER — Encounter (HOSPITAL_COMMUNITY): Payer: Self-pay | Admitting: Emergency Medicine

## 2015-08-09 DIAGNOSIS — Z791 Long term (current) use of non-steroidal anti-inflammatories (NSAID): Secondary | ICD-10-CM | POA: Diagnosis not present

## 2015-08-09 DIAGNOSIS — R202 Paresthesia of skin: Secondary | ICD-10-CM

## 2015-08-09 DIAGNOSIS — Z87891 Personal history of nicotine dependence: Secondary | ICD-10-CM | POA: Diagnosis not present

## 2015-08-09 DIAGNOSIS — I10 Essential (primary) hypertension: Secondary | ICD-10-CM | POA: Diagnosis not present

## 2015-08-09 DIAGNOSIS — R200 Anesthesia of skin: Secondary | ICD-10-CM | POA: Diagnosis present

## 2015-08-09 DIAGNOSIS — Z79899 Other long term (current) drug therapy: Secondary | ICD-10-CM | POA: Diagnosis not present

## 2015-08-09 LAB — COMPREHENSIVE METABOLIC PANEL
ALBUMIN: 4.5 g/dL (ref 3.5–5.0)
ALT: 16 U/L (ref 14–54)
AST: 20 U/L (ref 15–41)
Alkaline Phosphatase: 51 U/L (ref 38–126)
Anion gap: 8 (ref 5–15)
BILIRUBIN TOTAL: 0.6 mg/dL (ref 0.3–1.2)
CHLORIDE: 101 mmol/L (ref 101–111)
CO2: 27 mmol/L (ref 22–32)
CREATININE: 0.77 mg/dL (ref 0.44–1.00)
Calcium: 9.3 mg/dL (ref 8.9–10.3)
GFR calc Af Amer: >60 mL/min (ref >60)
GFR calc non Af Amer: >60 mL/min (ref >60)
GLUCOSE: 102 mg/dL — AB (ref 65–99)
POTASSIUM: 3.7 mmol/L (ref 3.5–5.1)
Sodium: 136 mmol/L (ref 135–145)
TOTAL PROTEIN: 7.4 g/dL (ref 6.5–8.1)

## 2015-08-09 LAB — APTT: APTT: 28 s (ref 24–37)

## 2015-08-09 LAB — DIFFERENTIAL
BASOS ABS: 0 10*3/uL (ref 0.0–0.1)
Basophils Relative: 1 %
EOS ABS: 0.2 10*3/uL (ref 0.0–0.7)
Eosinophils Relative: 2 %
Lymphocytes Relative: 32 %
Lymphs Abs: 2 10*3/uL (ref 0.7–4.0)
Monocytes Absolute: 0.5 10*3/uL (ref 0.1–1.0)
Monocytes Relative: 8 %
NEUTROS PCT: 57 %
Neutro Abs: 3.6 10*3/uL (ref 1.7–7.7)

## 2015-08-09 LAB — URINALYSIS, ROUTINE W REFLEX MICROSCOPIC
Bilirubin Urine: NEGATIVE
Glucose, UA: NEGATIVE mg/dL
Hgb urine dipstick: NEGATIVE
Ketones, ur: NEGATIVE mg/dL
LEUKOCYTES UA: NEGATIVE
Nitrite: NEGATIVE
PH: 7 (ref 5.0–8.0)
Protein, ur: NEGATIVE mg/dL
SPECIFIC GRAVITY, URINE: 1.009 (ref 1.005–1.030)

## 2015-08-09 LAB — I-STAT TROPONIN, ED: Troponin i, poc: 0 ng/mL (ref 0.00–0.08)

## 2015-08-09 LAB — I-STAT CHEM 8, ED
BUN: 5 mg/dL — AB (ref 6–20)
CREATININE: 0.7 mg/dL (ref 0.44–1.00)
Calcium, Ion: 1.11 mmol/L — ABNORMAL LOW (ref 1.12–1.23)
Chloride: 99 mmol/L — ABNORMAL LOW (ref 101–111)
Glucose, Bld: 102 mg/dL — ABNORMAL HIGH (ref 65–99)
HEMATOCRIT: 42 % (ref 36.0–46.0)
Hemoglobin: 14.3 g/dL (ref 12.0–15.0)
POTASSIUM: 3.7 mmol/L (ref 3.5–5.1)
Sodium: 138 mmol/L (ref 135–145)
TCO2: 28 mmol/L (ref 0–100)

## 2015-08-09 LAB — CBC
HEMATOCRIT: 42 % (ref 36.0–46.0)
Hemoglobin: 14.4 g/dL (ref 12.0–15.0)
MCH: 30.3 pg (ref 26.0–34.0)
MCHC: 34.3 g/dL (ref 30.0–36.0)
MCV: 88.4 fL (ref 78.0–100.0)
Platelets: 238 10*3/uL (ref 150–400)
RBC: 4.75 MIL/uL (ref 3.87–5.11)
RDW: 12.8 % (ref 11.5–15.5)
WBC: 6.3 10*3/uL (ref 4.0–10.5)

## 2015-08-09 LAB — PROTIME-INR
INR: 1.01 (ref 0.00–1.49)
Prothrombin Time: 13.5 seconds (ref 11.6–15.2)

## 2015-08-09 MED ORDER — LORAZEPAM 2 MG/ML IJ SOLN
1.0000 mg | Freq: Once | INTRAMUSCULAR | Status: AC
  Administered 2015-08-09: 1 mg via INTRAVENOUS
  Filled 2015-08-09: qty 1

## 2015-08-09 MED ORDER — GADOBENATE DIMEGLUMINE 529 MG/ML IV SOLN
15.0000 mL | Freq: Once | INTRAVENOUS | Status: AC | PRN
  Administered 2015-08-09: 15 mL via INTRAVENOUS

## 2015-08-09 NOTE — ED Notes (Signed)
Patient verbalized understanding of discharge instructions and denies any further needs or questions at this time. VS stable. Patient ambulatory with steady gait, assisted to ED entrance in wheelchair.  

## 2015-08-09 NOTE — ED Notes (Signed)
Pt started having numbness on right side of face-- has an app with neurologist June 26th for same-- states has had intermittent facial numbness and lip numbness approx 2 months ago-- pt also states that she is under "a lot of stress"

## 2015-08-09 NOTE — ED Notes (Signed)
Pt ambulatory upon arrival.

## 2015-08-09 NOTE — ED Provider Notes (Signed)
CSN: [id number]     Arrival date & time 08/09/15  1307 History   First MD Initiated Contact with Patient 08/09/15 1750     Chief Complaint  Patient presents with  . facial numbness      (Consider location/radiation/quality/duration/timing/severity/associated sxs/prior Treatment) The history is provided by the patient.  45 year old female has had 2 episodes of numbness on the right side of her face. The first was 12 days ago and the second episode was today. She also feels that she has a right-sided facial droop and notices that liquids trip out of that side of her mouth. She denies headache denies vision change. She denies any weakness, numbness, telling. She states that she has had some difficulty with balance intermittently and did fall on one occasion. In the past, she has had some episodes of vision blurring and some episodes of unexplained numbness which have never been completely investigated. Her PCP had referred her to a neurologist and appointment was June 26. However, with recurrence of symptoms today, but was moved up to June 12 and, and she was advised to come to the ED. She does state that she isn't under a lot of stress related to problems with her son.  Past Medical History  Diagnosis Date  . Arthritis     bilateral knees  . PONV (postoperative nausea and vomiting)   . Pneumonia     uses inhaler when sick  . Fibromyalgia   . Seizures (HCC) 04/18/2011    pt states "never had a seizure"  . Hypertension     no meds    Past Surgical History  Procedure Laterality Date  . Knee surgery      1 on left, 2 on the right knee  . Cesarean section      x 4  . Tubal ligation    . Nasal sinus surgery    . Wisdom tooth extraction    . Tonsillectomy    . Neck surgery  07/2011    rod  . Dilitation & currettage/hystroscopy with novasure ablation N/A 03/16/2014    Procedure: DILATATION & CURETTAGE/HYSTEROSCOPY WITH NOVASURE ABLATION;  Surgeon: Kirkland HunArthur Stringer, MD;  Location: [facility];   Service: Gynecology;  Laterality: N/A;   Family History  Problem Relation Age of Onset  . Stroke Paternal Grandmother   . Heart attack Paternal Grandfather    Social History  Substance Use Topics  . Smoking status: Former Smoker    Types: Cigarettes    Quit date: 04/18/1991  . Smokeless tobacco: Never Used  . Alcohol Use: No   OB History    Gravida Para Term Preterm AB TAB SAB Ectopic Multiple Living   4 4             Review of Systems  All other systems reviewed and are negative.     Allergies  Penicillins  Home Medications   Prior to Admission medications   Medication Sig Start Date End Date Taking? Authorizing Provider  albuterol (PROVENTIL HFA;VENTOLIN HFA) 108 (90 BASE) MCG/ACT inhaler Inhale 2 puffs into the lungs every 6 (six) hours as needed for wheezing.     Historical Provider, MD  cyclobenzaprine (FLEXERIL) 10 MG tablet Take 1 tablet (10 mg total) by mouth 2 (two) times daily as needed for muscle spasms. 07/22/13   Purvis SheffieldForrest Harrison, MD  diphenhydrAMINE (BENADRYL) 25 MG tablet Take 25 mg by mouth every 6 (six) hours as needed for allergies.    Historical Provider, MD  ibuprofen (ADVIL,MOTRIN) 200 MG  tablet Take 400 mg by mouth every 6 (six) hours as needed for mild pain.    Historical Provider, MD  ibuprofen (ADVIL,MOTRIN) 800 MG tablet Take 1 tablet (800 mg total) by mouth every 8 (eight) hours as needed. 03/16/14   Kirkland Hun, MD  loratadine (CLARITIN) 10 MG tablet Take 10 mg by mouth daily as needed for allergies.    Historical Provider, MD  oxyCODONE-acetaminophen (ROXICET) 5-325 MG per tablet Take 1 tablet by mouth every 4 (four) hours as needed for severe pain. 03/16/14   Kirkland Hun, MD  promethazine (PHENERGAN) 12.5 MG tablet Take 1 tablet (12.5 mg total) by mouth every 6 (six) hours as needed for nausea or vomiting. 03/16/14   Kirkland Hun, MD  Pseudoephedrine-DM-GG [facility] COLD & COUGH PO) Take 5 mLs by mouth as needed (cough).    Historical  Provider, MD   BP 174/110 mmHg  Pulse 86  Temp(Src) 98.8 F (37.1 C) (Oral)  Resp 18  SpO2 100% Physical Exam  Nursing note and vitals reviewed.  45 year old female, resting comfortably and in no acute distress. Vital signs are significant for hypertension. Oxygen saturation is 100%, which is normal. Head is normocephalic and atraumatic. PERRLA, EOMI. Oropharynx is clear. Neck is nontender and supple without adenopathy or JVD. Back is nontender and there is no CVA tenderness. Lungs are clear without rales, wheezes, or rhonchi. Chest is nontender. Heart has regular rate and rhythm without murmur. Abdomen is soft, flat, nontender without masses or hepatosplenomegaly and peristalsis is normoactive. Extremities have no cyanosis or edema, full range of motion is present. Skin is warm and dry without rash. Neurologic: Mental status is normal, cranial nerves are intact, there are no motor or sensory deficits. Finger-to-nose testing is normal. Motor strength is 5/5 in both arms and both legs. No extinction on double simultaneous stimulation.  ED Course  Procedures (including critical care time) Labs Review Results for orders placed or performed during the hospital encounter of 08/09/15  Protime-INR  Result Value Ref Range   Prothrombin Time 13.5 11.6 - 15.2 seconds   INR 1.01 0.00 - 1.49  APTT  Result Value Ref Range   aPTT 28 24 - 37 seconds  CBC  Result Value Ref Range   WBC 6.3 4.0 - 10.5 K/uL   RBC 4.75 3.87 - 5.11 MIL/uL   Hemoglobin 14.4 12.0 - 15.0 g/dL   HCT 16.1 09.6 - 04.5 %   MCV 88.4 78.0 - 100.0 fL   MCH 30.3 26.0 - 34.0 pg   MCHC 34.3 30.0 - 36.0 g/dL   RDW 40.9 81.1 - 91.4 %   Platelets 238 150 - 400 K/uL  Differential  Result Value Ref Range   Neutrophils Relative % 57 %   Neutro Abs 3.6 1.7 - 7.7 K/uL   Lymphocytes Relative 32 %   Lymphs Abs 2.0 0.7 - 4.0 K/uL   Monocytes Relative 8 %   Monocytes Absolute 0.5 0.1 - 1.0 K/uL   Eosinophils Relative 2 %    Eosinophils Absolute 0.2 0.0 - 0.7 K/uL   Basophils Relative 1 %   Basophils Absolute 0.0 0.0 - 0.1 K/uL  Comprehensive metabolic panel  Result Value Ref Range   Sodium 136 135 - 145 mmol/L   Potassium 3.7 3.5 - 5.1 mmol/L   Chloride 101 101 - 111 mmol/L   CO2 27 22 - 32 mmol/L   Glucose, Bld 102 (H) 65 - 99 mg/dL   BUN <5 (L) 6 - 20  mg/dL   Creatinine, Ser 9.60 0.44 - 1.00 mg/dL   Calcium 9.3 8.9 - 45.4 mg/dL   Total Protein 7.4 6.5 - 8.1 g/dL   Albumin 4.5 3.5 - 5.0 g/dL   AST 20 15 - 41 U/L   ALT 16 14 - 54 U/L   Alkaline Phosphatase 51 38 - 126 U/L   Total Bilirubin 0.6 0.3 - 1.2 mg/dL   GFR calc non Af Amer >60 >60 mL/min   GFR calc Af Amer >60 >60 mL/min   Anion gap 8 5 - 15  Urinalysis, Routine w reflex microscopic (not at [facility])  Result Value Ref Range   Color, Urine YELLOW YELLOW   APPearance CLEAR CLEAR   Specific Gravity, Urine 1.009 1.005 - 1.030   pH 7.0 5.0 - 8.0   Glucose, UA NEGATIVE NEGATIVE mg/dL   Hgb urine dipstick NEGATIVE NEGATIVE   Bilirubin Urine NEGATIVE NEGATIVE   Ketones, ur NEGATIVE NEGATIVE mg/dL   Protein, ur NEGATIVE NEGATIVE mg/dL   Nitrite NEGATIVE NEGATIVE   Leukocytes, UA NEGATIVE NEGATIVE  I-stat troponin, ED  Result Value Ref Range   Troponin i, poc 0.00 0.00 - 0.08 ng/mL   Comment 3          I-Stat Chem 8, ED  Result Value Ref Range   Sodium 138 135 - 145 mmol/L   Potassium 3.7 3.5 - 5.1 mmol/L   Chloride 99 (L) 101 - 111 mmol/L   BUN 5 (L) 6 - 20 mg/dL   Creatinine, Ser 0.98 0.44 - 1.00 mg/dL   Glucose, Bld 119 (H) 65 - 99 mg/dL   Calcium, Ion 1.47 (L) 1.12 - 1.23 mmol/L   TCO2 28 0 - 100 mmol/L   Hemoglobin 14.3 12.0 - 15.0 g/dL   HCT 82.9 56.2 - 13.0 %   Imaging Review Ct Head Wo Contrast  08/09/2015  CLINICAL DATA:  Intermittent right-sided facial numbness. EXAM: CT HEAD WITHOUT CONTRAST TECHNIQUE: Contiguous axial images were obtained from the base of the skull through the vertex without intravenous contrast. COMPARISON:   Jul 22, 2013 FINDINGS: The paranasal sinuses, mastoid air cells, bones, and extracranial soft tissues are normal. No subdural, epidural, or subarachnoid hemorrhage. No mass, mass effect, or midline shift. No acute cortical ischemia or infarct. Ventricles and sulci are normal. Calcifications are seen in the basal ganglia, right greater than left. IMPRESSION: No acute abnormalities. Electronically Signed   By: Gerome Sam III M.D   On: 08/09/2015 14:47   Mr Brain W Wo Contrast  08/09/2015  CLINICAL DATA:  Right-sided facial numbness. Lip numbness. Symptoms for 2 months. EXAM: MRI HEAD WITHOUT AND WITH CONTRAST TECHNIQUE: Multiplanar, multiecho pulse sequences of the brain and surrounding structures were obtained without and with intravenous contrast. CONTRAST:  15mL MULTIHANCE GADOBENATE DIMEGLUMINE 529 MG/ML IV SOLN COMPARISON:  CT head earlier today. FINDINGS: No evidence for acute infarction, hemorrhage, mass lesion, hydrocephalus, or extra-axial fluid. Normal cerebral volume. Minor white matter disease, nonspecific. Flow voids are maintained throughout the carotid, basilar, and vertebral arteries. There are no areas of chronic hemorrhage. Pituitary, pineal, and cerebellar tonsils unremarkable. No upper cervical lesions. Mineralized basal ganglia. Visualized calvarium, skull base, and upper cervical osseous structures unremarkable. Scalp and extracranial soft tissues, orbits, sinuses, and mastoids show no acute process. Post infusion, no abnormal enhancement of the brain or meninges. Major dural venous sinuses widely patent. Good general agreement with earlier CT. IMPRESSION: Minor white matter disease, nonspecific. No acute intracranial findings. No intracranial or extracranial cause  is seen for the reported symptoms. Electronically Signed   By: Elsie Stain M.D.   On: 08/09/2015 19:52   I have personally reviewed and evaluated these images and lab results as part of my medical decision-making.    MDM    Final diagnoses:  Numbness and tingling of right face    Complaints of facial numbness without objective findings. No evidence of any cerebellar dysfunction. No objective weakness. No extinction on double simultaneous stimulation. Symptoms could be related to anxiety but could also represent multiple sclerosis. She will be sent for MRI scan.  MRI is unremarkable. Patient is given reassurance and she is discharged with instructions to follow-up with her neurologist as scheduled.  Dione Booze, MD 08/09/15 2032

## 2015-08-09 NOTE — ED Notes (Signed)
Patient ambulatory to restroom with steady gait, denies dizziness.

## 2015-08-09 NOTE — Discharge Instructions (Signed)
Paresthesia Paresthesia is an abnormal burning or prickling sensation. This sensation is generally felt in the hands, arms, legs, or feet. However, it may occur in any part of the body. Usually, it is not painful. The feeling may be described as:  Tingling or numbness.  Pins and needles.  Skin crawling.  Buzzing.  Limbs falling asleep.  Itching. Most people experience temporary (transient) paresthesia at some time in their lives. Paresthesia may occur when you breathe too quickly (hyperventilation). It can also occur without any apparent cause. Commonly, paresthesia occurs when pressure is placed on a nerve. The sensation quickly goes away after the pressure is removed. For some people, however, paresthesia is a long-lasting (chronic) condition that is caused by an underlying disorder. If you continue to have paresthesia, you may need further medical evaluation. HOME CARE INSTRUCTIONS Watch your condition for any changes. Taking the following actions may help to lessen any discomfort that you are feeling:  Avoid drinking alcohol.  Try acupuncture or massage to help relieve your symptoms.  Keep all follow-up visits as directed by your health care provider. This is important. SEEK MEDICAL CARE IF:  You continue to have episodes of paresthesia.  Your burning or prickling feeling gets worse when you walk.  You have pain, cramps, or dizziness.  You develop a rash. SEEK IMMEDIATE MEDICAL CARE IF:  You feel weak.  You have trouble walking or moving.  You have problems with speech, understanding, or vision.  You feel confused.  You cannot control your bladder or bowel movements.  You have numbness after an injury.  You faint.   This information is not intended to replace advice given to you by your health care provider. Make sure you discuss any questions you have with your health care provider.   Document Released: 02/07/2002 Document Revised: 07/04/2014 Document Reviewed:  02/13/2014 Elsevier Interactive Patient Education 2016 [employer]  

## 2015-08-13 ENCOUNTER — Encounter: Payer: Self-pay | Admitting: Neurology

## 2015-08-13 ENCOUNTER — Ambulatory Visit (INDEPENDENT_AMBULATORY_CARE_PROVIDER_SITE_OTHER): Payer: 59 | Admitting: Neurology

## 2015-08-13 VITALS — BP 153/97 | HR 85 | Ht 62.0 in | Wt 169.6 lb

## 2015-08-13 DIAGNOSIS — F329 Major depressive disorder, single episode, unspecified: Secondary | ICD-10-CM | POA: Insufficient documentation

## 2015-08-13 DIAGNOSIS — F411 Generalized anxiety disorder: Secondary | ICD-10-CM

## 2015-08-13 DIAGNOSIS — F32A Depression, unspecified: Secondary | ICD-10-CM

## 2015-08-13 DIAGNOSIS — R208 Other disturbances of skin sensation: Secondary | ICD-10-CM

## 2015-08-13 DIAGNOSIS — I10 Essential (primary) hypertension: Secondary | ICD-10-CM

## 2015-08-13 DIAGNOSIS — R200 Anesthesia of skin: Secondary | ICD-10-CM

## 2015-08-13 NOTE — Patient Instructions (Signed)
-   your symptoms more associated with stress, anxiety, or depression. You may also have complicated migraine which can present with facial numbness.  - cope with stress, decrease anxiety level - continue celexa. May titrate up to 40mg  daily. - will do EEG to rule out seizure - continue to follow up with Dr. Katrinka BlazingSmith - consider psychological counseling if able - check  BP at home and record.  - follow up in 2 months.
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
## 2015-08-27 ENCOUNTER — Ambulatory Visit: Payer: 59 | Admitting: Neurology

## 2015-09-13 ENCOUNTER — Other Ambulatory Visit: Payer: 59

## 2015-09-18 ENCOUNTER — Ambulatory Visit (INDEPENDENT_AMBULATORY_CARE_PROVIDER_SITE_OTHER): Payer: 59 | Admitting: Neurology

## 2015-09-18 DIAGNOSIS — R200 Anesthesia of skin: Secondary | ICD-10-CM

## 2015-09-18 DIAGNOSIS — R2990 Unspecified symptoms and signs involving the nervous system: Secondary | ICD-10-CM

## 2015-09-18 DIAGNOSIS — R55 Syncope and collapse: Secondary | ICD-10-CM

## 2015-09-20 ENCOUNTER — Other Ambulatory Visit: Payer: 59

## 2015-09-20 ENCOUNTER — Telehealth: Payer: Self-pay | Admitting: *Deleted

## 2015-09-20 NOTE — Telephone Encounter (Signed)
Thank you, Corrie DandyMary.   Marvel PlanJindong Gillis Boardley, MD PhD Stroke Neurology 09/20/2015 6:20 PM

## 2015-09-20 NOTE — Telephone Encounter (Signed)
Per Dr Roda ShuttersXu, spoke with patient and informed her that her EEG results are normal, no seizures. She then stated that she thinks she just has migraines that don't really have any treatment options.  She stated she had surgery for abscessed tooth yesterday and wonders if some of her problems were just coming from that. She stated she doesn't see a need to follow up with Dr Roda ShuttersXu; her "EEG was the last ditch effort to see what is wrong with her". Advised that she should FU with him to discuss further all her issues she discussed with him at her last OV. She stated she did not want to pay another co pay, and she requested that appointment be canceled.  Informed her this RN will cancel and inform Dr Roda ShuttersXu. She verbalized understanding, appreciation for call.

## 2015-10-10 ENCOUNTER — Ambulatory Visit: Payer: 59 | Admitting: Neurology

## 2016-04-02 ENCOUNTER — Other Ambulatory Visit: Payer: Self-pay

## 2016-05-29 ENCOUNTER — Other Ambulatory Visit: Payer: Self-pay | Admitting: Family Medicine

## 2016-05-29 ENCOUNTER — Ambulatory Visit
Admission: RE | Admit: 2016-05-29 | Discharge: 2016-05-29 | Disposition: A | Discharge status: Home / Self Care | Payer: 59 | Source: Ambulatory Visit | Attending: Family Medicine | Admitting: Family Medicine

## 2016-05-29 DIAGNOSIS — M545 Low back pain: Secondary | ICD-10-CM

## 2016-12-18 ENCOUNTER — Other Ambulatory Visit: Payer: Self-pay | Admitting: Obstetrics and Gynecology

## 2017-04-10 IMAGING — CT CT HEAD W/O CM
3 series · 16 of 46 positions shown, 19 images · non-contrast
Comparison: July 22, 2013

CLINICAL DATA: Intermittent right-sided facial numbness.

EXAM:
CT HEAD WITHOUT CONTRAST
TECHNIQUE: Contiguous axial images were obtained from the base of the skull
through the vertex without intravenous contrast.

[Series 2: head 5.0 h30s · axial · 0.41mm/px · z∈[-162,-42]mm · 10 of 29 slices shown, 13 images]
[im 3/29  brain]
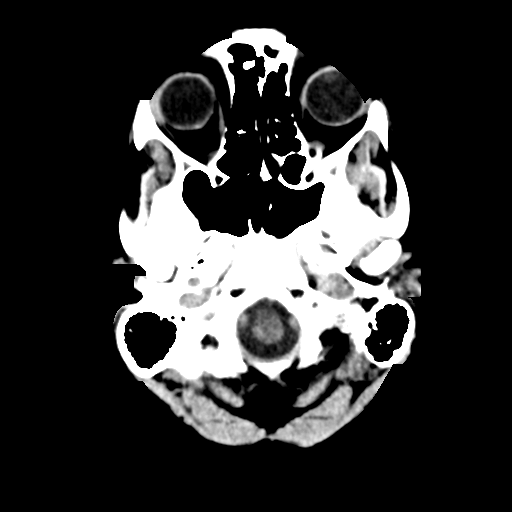
[im 3/29  bone]
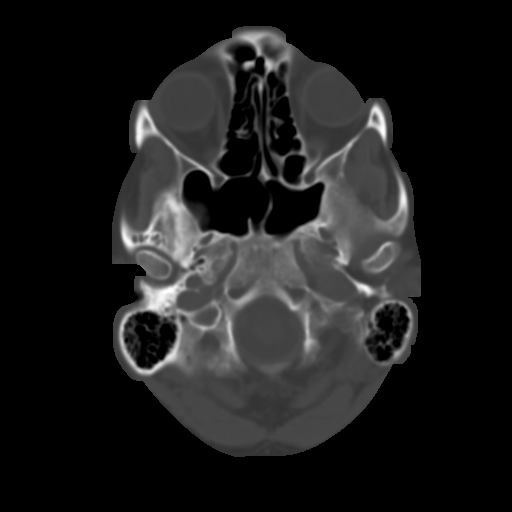
[im 6/29  brain]
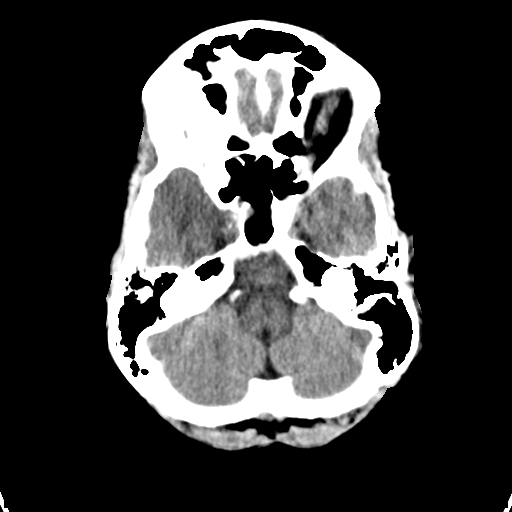
[im 8/29  brain]
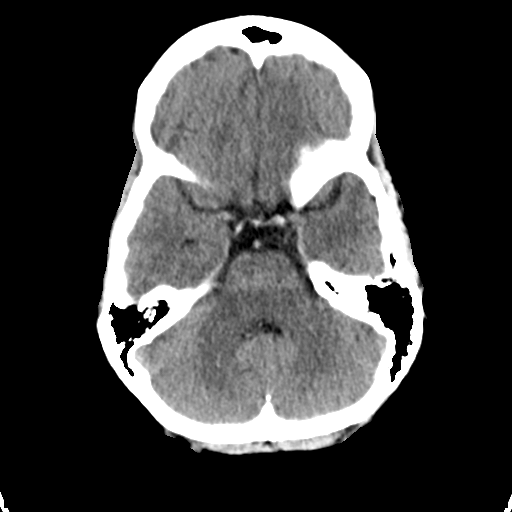
[im 11/29  brain]
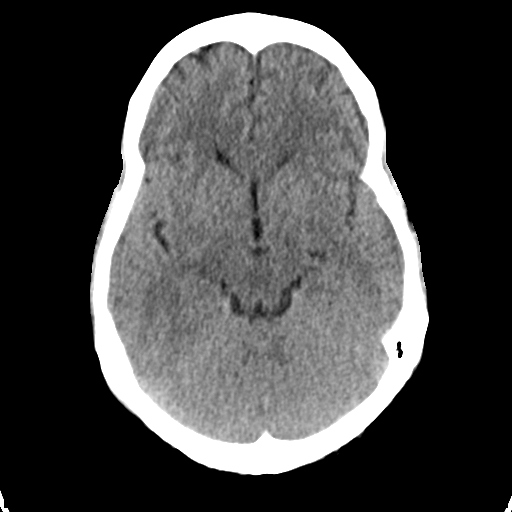
[im 14/29  brain]
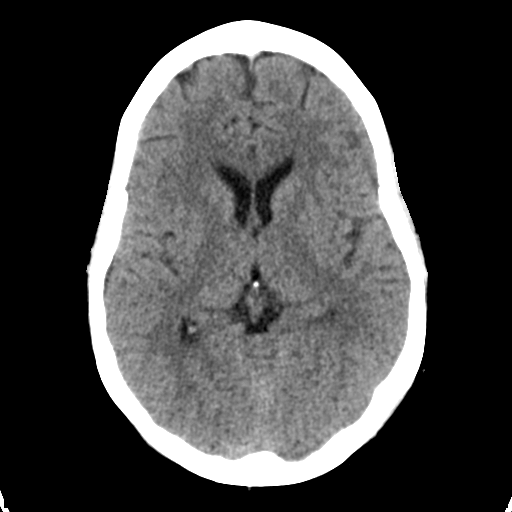
[im 14/29  bone]
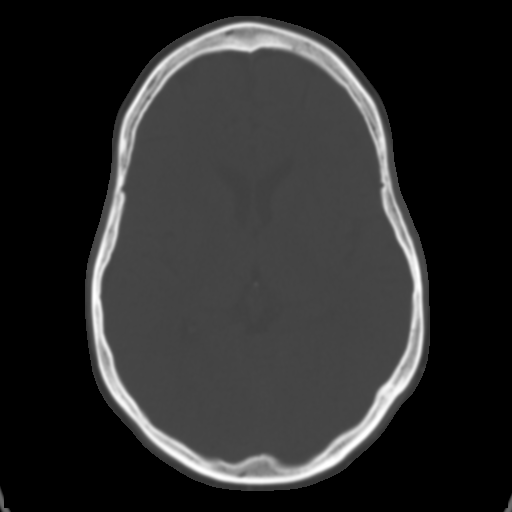
[im 16/29  brain]
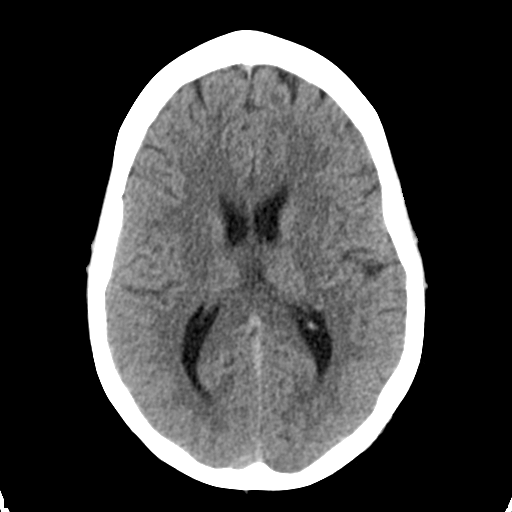
[im 19/29  brain]
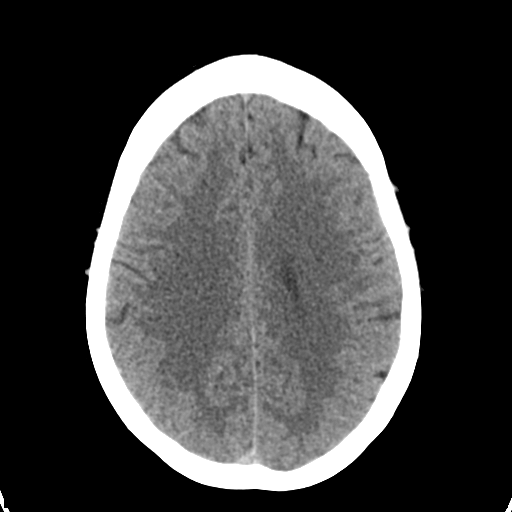
[im 22/29  brain]
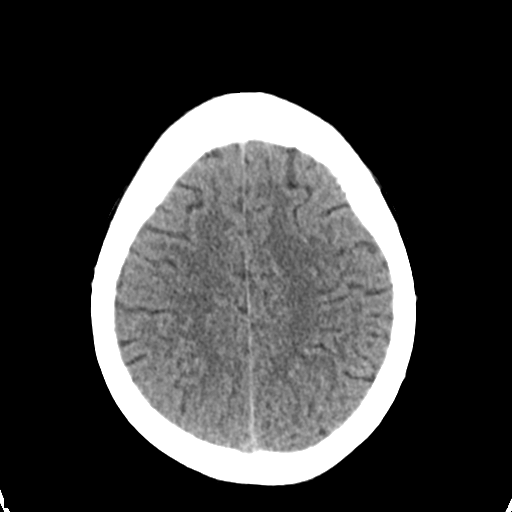
[im 24/29  brain]
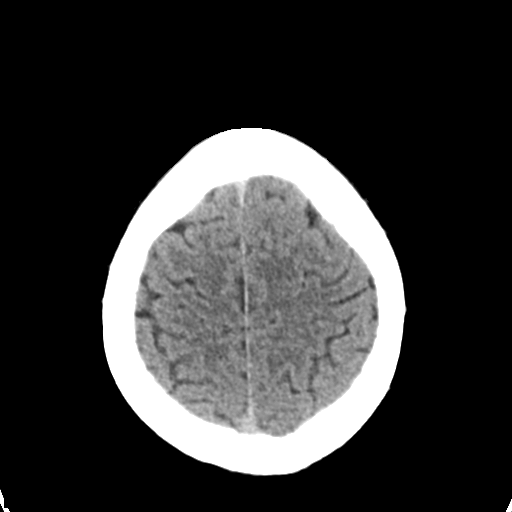
[im 24/29  bone]
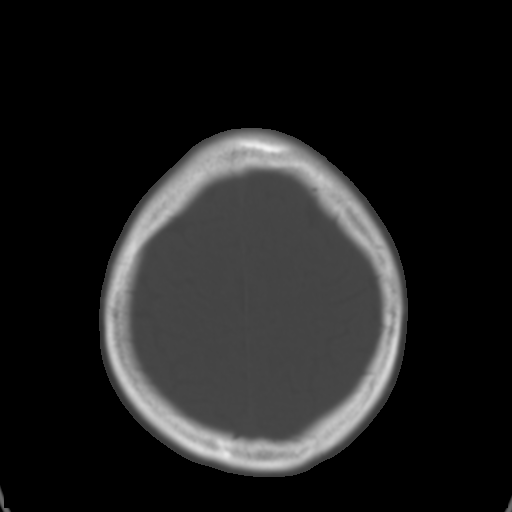
[im 27/29  brain]
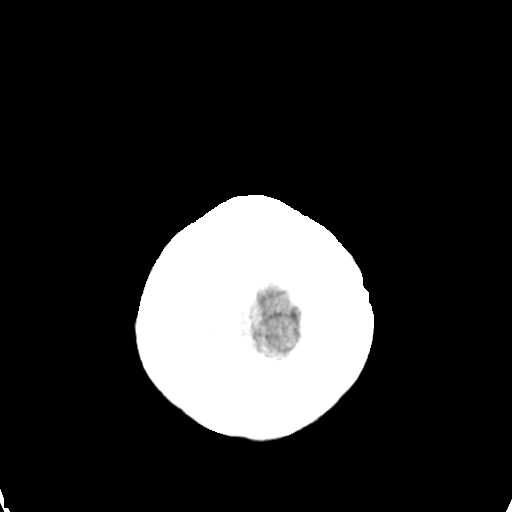

[Series 4: head 3.0 mpr · coronal · 0.31mm/px · 3 of 69 slices shown (1 of 2)]
[im 23/69  brain]
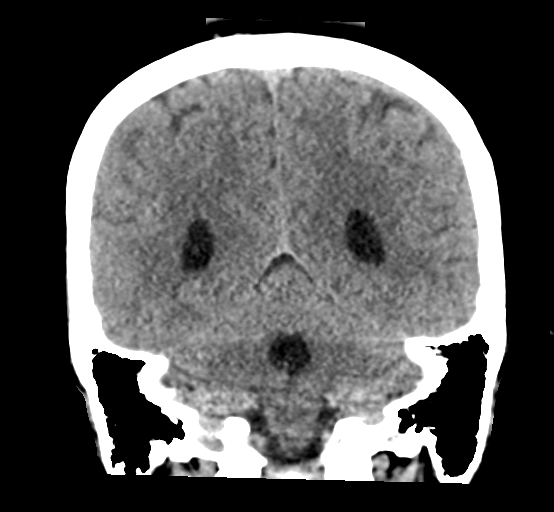
[im 31/69  brain]
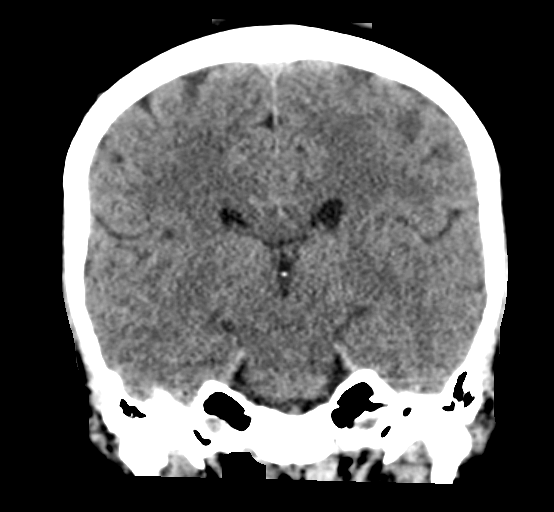
[im 38/69  brain]
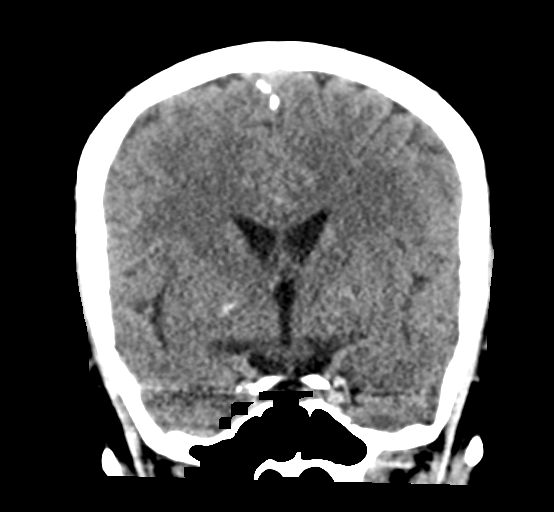

[Series 5: head 3.0 mpr · sagittal · 0.31mm/px · 3 of 52 slices shown (2 of 2)]
[im 18/52  brain]
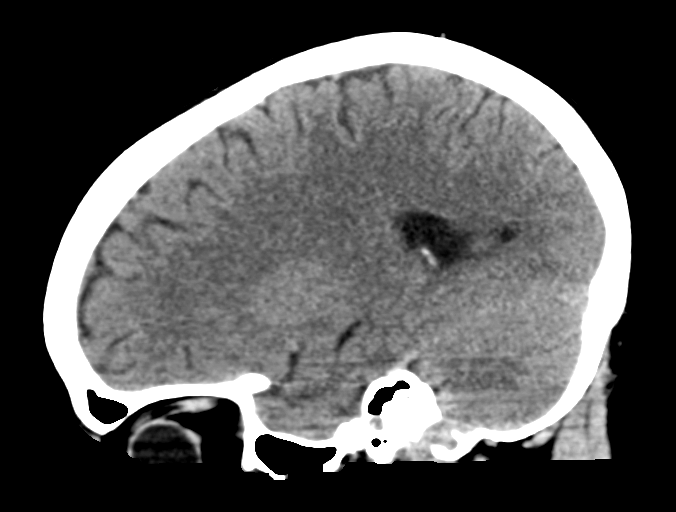
[im 26/52  brain]
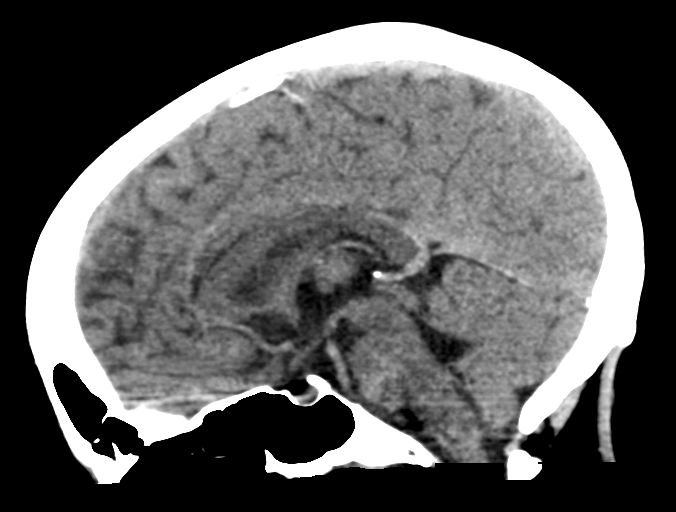
[im 35/52  brain]
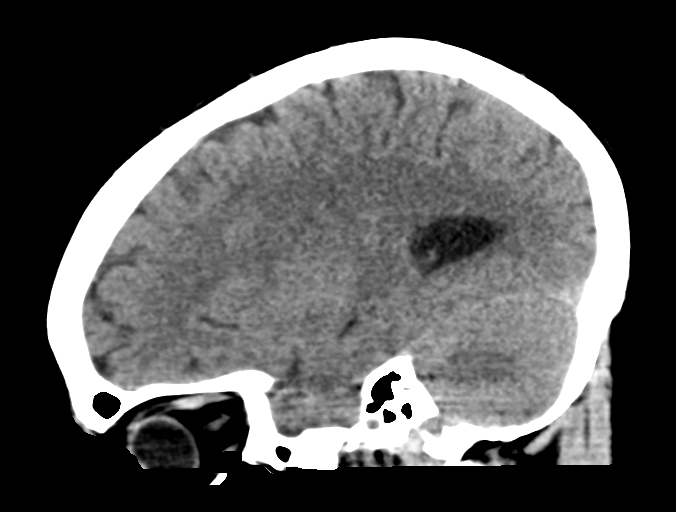

[16 of 46 positions shown; findings below may reference images not displayed]

FINDINGS: The paranasal sinuses, mastoid air cells, bones, and extracranial
soft tissues are normal. No subdural, epidural, or subarachnoid
hemorrhage. No mass, mass effect, or midline shift. No acute
cortical ischemia or infarct. Ventricles and sulci are normal.
Calcifications are seen in the basal ganglia, right greater than
left.
IMPRESSION: No acute abnormalities.

## 2018-01-29 IMAGING — CR DG LUMBAR SPINE 2-3V
3 series · 3 of 3 positions shown · non-contrast
Comparison: CT of the chest, abdomen and pelvis performed
07/22/2013

CLINICAL DATA: Fall 3 years ago, with chronic lower back pain.
Initial encounter.

EXAM:
LUMBAR SPINE - 2-3 VIEW

[w lumbar spine ap]
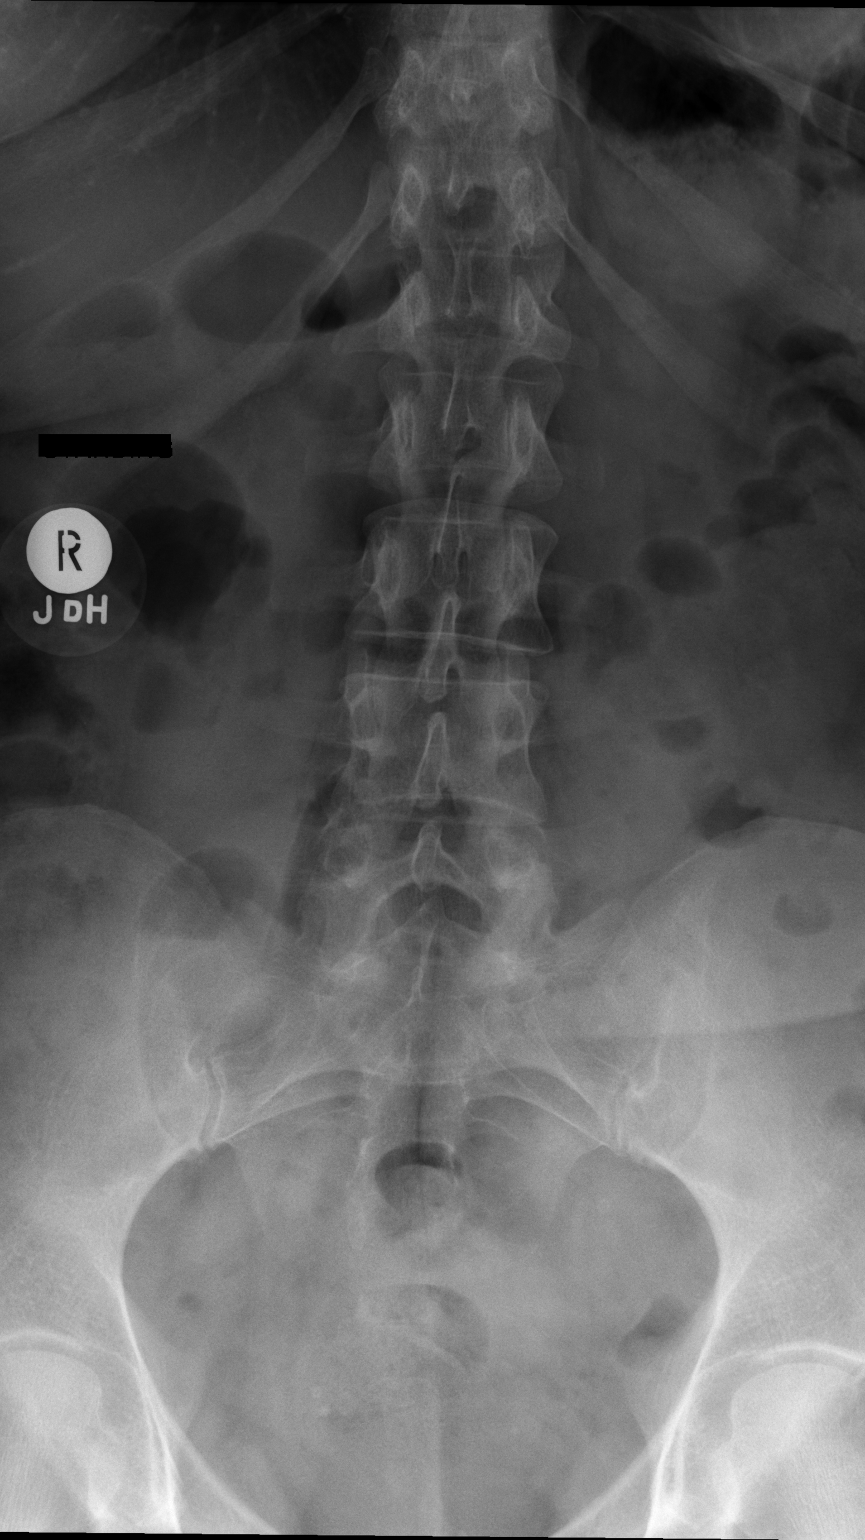

[w lumbar spine lat]
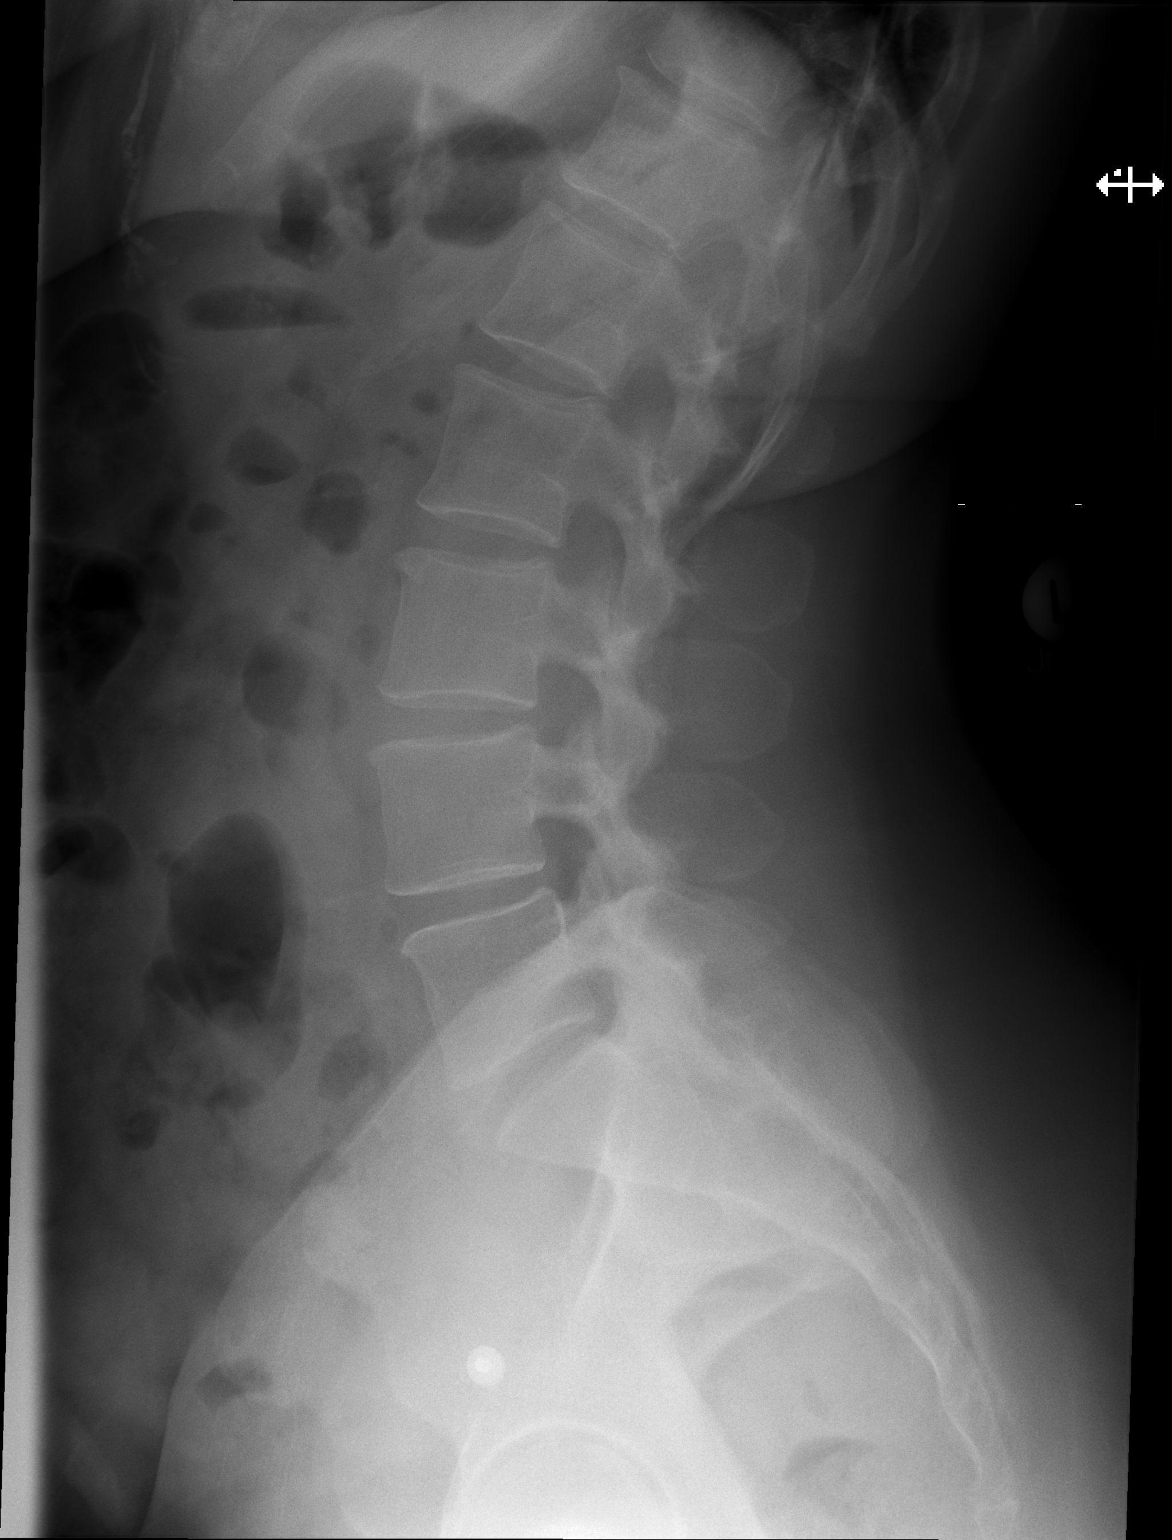

[w lumbar l-5 s-1 spot]
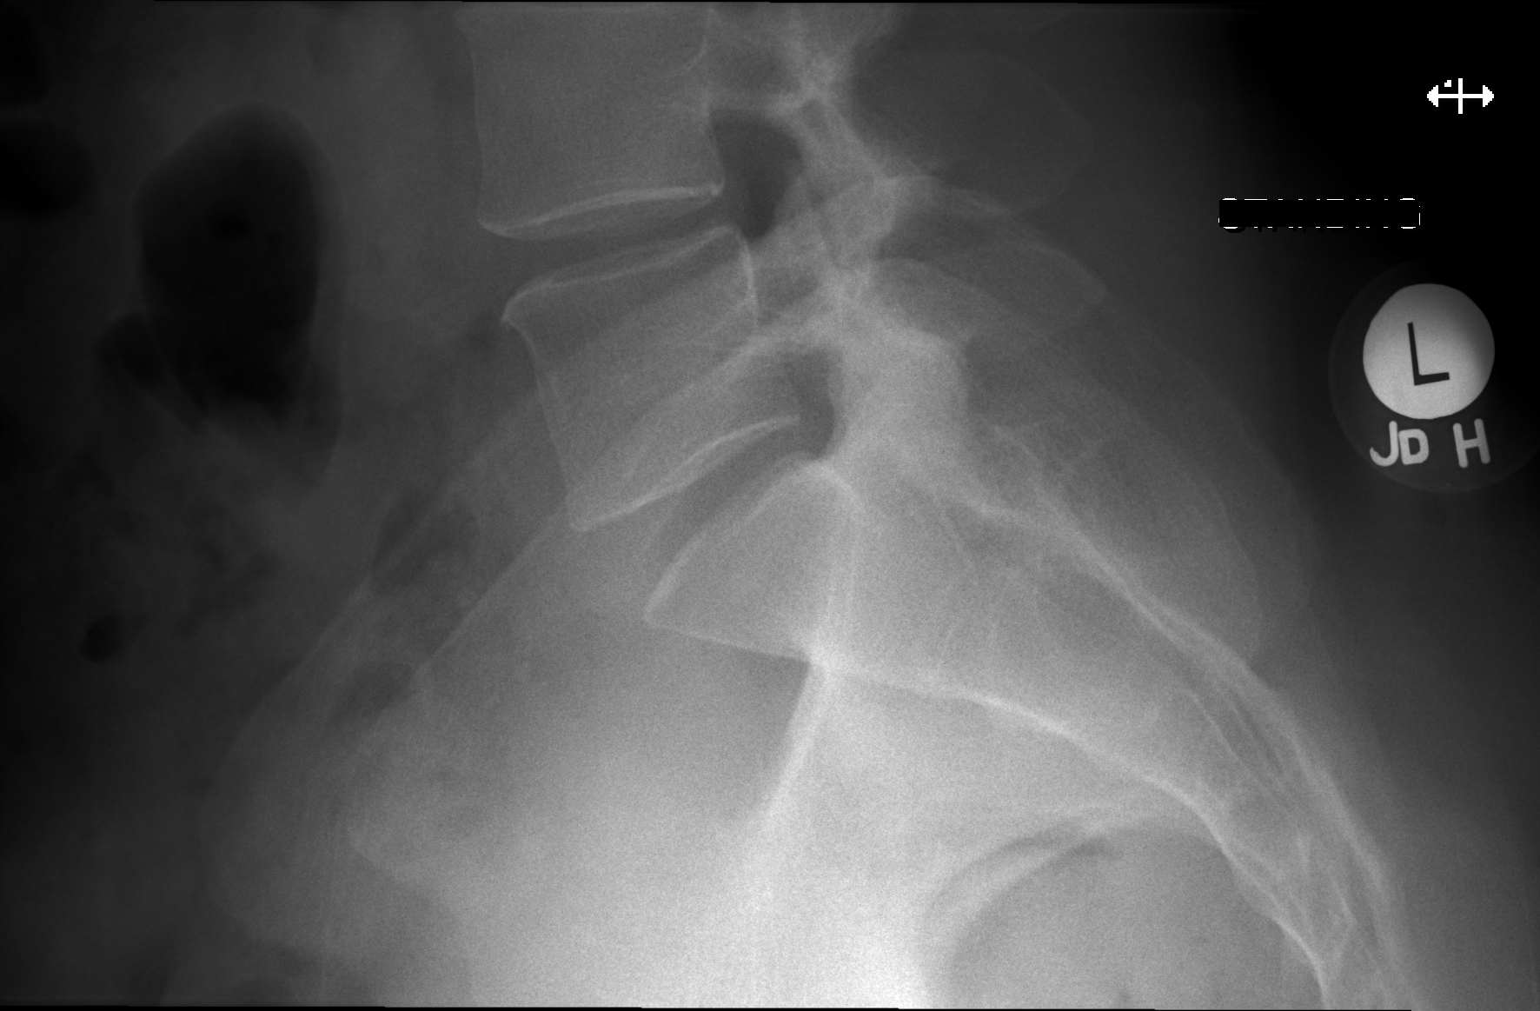

[3 of 3 positions shown; findings below may reference images not displayed]

FINDINGS: There is no evidence of fracture or subluxation. Vertebral bodies
demonstrate normal height and alignment. Intervertebral disc spaces
are preserved. The visualized neural foramina are grossly
unremarkable in appearance.

The visualized bowel gas pattern is unremarkable in appearance; air
and stool are noted within the colon. The sacroiliac joints are
within normal limits.
IMPRESSION: No evidence of fracture or subluxation along the lumbar spine.
